# Patient Record
Sex: Female | Born: 1995 | Race: Black or African American | Hispanic: No | Marital: Single | Smoking: Never smoker
Health system: Southern US, Community
[De-identification: ages and names within clinical notes are randomized; demographics above are authoritative.]

## PROBLEM LIST (undated history)

## (undated) DIAGNOSIS — F419 Anxiety disorder, unspecified: Secondary | ICD-10-CM

## (undated) DIAGNOSIS — J45909 Unspecified asthma, uncomplicated: Secondary | ICD-10-CM

## (undated) DIAGNOSIS — T7840XA Allergy, unspecified, initial encounter: Secondary | ICD-10-CM

## (undated) DIAGNOSIS — J069 Acute upper respiratory infection, unspecified: Secondary | ICD-10-CM

## (undated) DIAGNOSIS — J449 Chronic obstructive pulmonary disease, unspecified: Secondary | ICD-10-CM

## (undated) HISTORY — DX: Allergy, unspecified, initial encounter: T78.40XA

## (undated) HISTORY — DX: Unspecified asthma, uncomplicated: J45.909

## (undated) HISTORY — DX: Chronic obstructive pulmonary disease, unspecified: J44.9

## (undated) HISTORY — DX: Acute upper respiratory infection, unspecified: J06.9

## (undated) HISTORY — DX: Anxiety disorder, unspecified: F41.9

---

## 2021-01-03 ENCOUNTER — Ambulatory Visit
Admission: RE | Admit: 2021-01-03 | Discharge: 2021-01-03 | Disposition: A | Discharge status: Home / Self Care | Payer: 59 | Source: Ambulatory Visit | Attending: Nurse Practitioner | Admitting: Nurse Practitioner

## 2021-01-03 ENCOUNTER — Encounter: Payer: Self-pay | Admitting: Nurse Practitioner

## 2021-01-03 ENCOUNTER — Ambulatory Visit: Payer: 59 | Admitting: Nurse Practitioner

## 2021-01-03 ENCOUNTER — Other Ambulatory Visit: Payer: Self-pay

## 2021-01-03 VITALS — BP 116/80 | HR 70 | Temp 98.4°F | Ht 61.0 in | Wt 220.8 lb

## 2021-01-03 DIAGNOSIS — J209 Acute bronchitis, unspecified: Secondary | ICD-10-CM

## 2021-01-03 DIAGNOSIS — Z2821 Immunization not carried out because of patient refusal: Secondary | ICD-10-CM | POA: Diagnosis not present

## 2021-01-03 DIAGNOSIS — R059 Cough, unspecified: Secondary | ICD-10-CM | POA: Diagnosis not present

## 2021-01-03 DIAGNOSIS — Z1159 Encounter for screening for other viral diseases: Secondary | ICD-10-CM

## 2021-01-03 DIAGNOSIS — Z13228 Encounter for screening for other metabolic disorders: Secondary | ICD-10-CM

## 2021-01-03 DIAGNOSIS — Z7689 Persons encountering health services in other specified circumstances: Secondary | ICD-10-CM | POA: Diagnosis not present

## 2021-01-03 DIAGNOSIS — Z87892 Personal history of anaphylaxis: Secondary | ICD-10-CM

## 2021-01-03 MED ORDER — IPRATROPIUM-ALBUTEROL 20-100 MCG/ACT IN AERS: 1.0000 | INHALATION_SPRAY | Freq: Four times a day (QID) | RESPIRATORY_TRACT | 2 refills | Status: DC | PRN

## 2021-01-03 MED ORDER — TRIAMCINOLONE ACETONIDE 40 MG/ML IJ SUSP
40.0000 mg | Freq: Once | INTRAMUSCULAR | Status: AC
  Administered 2021-01-03: 40 mg via INTRAMUSCULAR

## 2021-01-03 NOTE — Progress Notes (Signed)
I,Yamilka [employer] as a Education administrator for [employer], FNP.,have documented all relevant documentation on the behalf of [name], FNP,as directed by  [name], FNP while in the presence of [name], [place]. This visit occurred during the SARS-CoV-2 public health emergency.  Safety protocols were in place, including screening questions prior to the visit, additional usage of staff PPE, and extensive cleaning of exam room while observing appropriate contact time as indicated for disinfecting solutions.  Subjective:     Patient ID: Dana Galvan , female    DOB: 12-31-1995 , 25 y.o.   MRN: [id number]   Chief Complaint  Patient presents with  . Establish Care  . Cough    Patient stated she has had a cough for the past month. She has done a covid test 3 times all were negative. She stated she has tried a few meds and none have worked for her. She is coughing so much she is vomiting now.     HPI  Patient here to establish primary care. She has not had a PCP since the age of 25 y/o. She works for the [employer] in the [employer]. Single. No children.  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  She is originally from [place], [place].  She has not a covid vaccine.  She has a sister who passed from anaphylaxis a couple years ago.  She goes to [facility]. She has had albuterol inhalers in the past but was never diagnosed with asthma.   PMH - healthy   Causey - mother - diabetes, HTN, MI (less than 50 y/o) - stent.  Father - unknown.  She is unaware of grandparents health history all deceased.  Maternal Uncle - colon cancer. She has 10 brothers and sisters and says they are all healthy.   She has had a cough for the last month, she vomited 4 times yesterday due to cough.  She seen a virtual visit on Tuesday last week and was given tessalon perles. She was prescribed an allergy pills.  He wanted her to get Symbicort  but did not get due to being $100. Denies history of smoking.  She does recall having a steroid in the past without any issues  Cough This is a new problem. The current episode started more than 1 month ago. The problem has been unchanged. Cough characteristics: occasional phlegm will come up. Pertinent negatives include no chest pain, fever or headaches. She has tried OTC cough suppressant and prescription cough suppressant for the symptoms.     History reviewed. No pertinent past medical history.   Family History  Problem Relation Age of Onset  . Diabetes Mother   . Heart disease Mother   . Carpal tunnel syndrome Mother   . Hypertension Mother   . Drug abuse Mother   . Alcohol abuse Mother   . Drug abuse Father   . Alcohol abuse Father      Current Outpatient Medications:  .  Etonogestrel (NEXPLANON [place]), Inject into the skin., Disp: , Rfl:  .  Ipratropium-Albuterol (COMBIVENT) 20-100 MCG/ACT AERS respimat, Inhale 1 puff into the lungs every 6 (six) hours as needed for wheezing., Disp: 4 g, Rfl: 2 .  benzonatate (TESSALON) 100 MG capsule, Take 200 mg by mouth 3 (three) times daily as needed for cough., Disp: , Rfl:  .  ondansetron (ZOFRAN) 4 MG tablet, Take 1 tablet (4 mg total) by mouth daily as needed  for nausea or vomiting., Disp: 30 tablet, Rfl: 1   Allergies  Allergen Reactions  . Amoxicillin Anaphylaxis  . Penicillins Anaphylaxis     Review of Systems  Constitutional: Negative.  Negative for fever.  HENT: Negative.   Respiratory: Positive for cough.   Cardiovascular: Negative.  Negative for chest pain, palpitations and leg swelling.  Gastrointestinal: Positive for vomiting.  Endocrine: Negative.  Negative for polydipsia, polyphagia and polyuria.  Genitourinary: Negative.   Musculoskeletal: Negative.   Skin: Negative.   Neurological: Negative.  Negative for dizziness and headaches.  Hematological: Negative.   Psychiatric/Behavioral: Negative.      Today's Vitals    01/03/21 1043  BP: 116/80  Pulse: 70  Temp: 98.4 F (36.9 C)  TempSrc: Oral  Weight: 220 lb 12.8 oz (100.2 kg)  Height: 5' 1" (1.549 m)  PainSc: 0-No pain   Body mass index is 41.72 kg/m.   Objective:  Physical Exam Constitutional:      General: She is not in acute distress.    Appearance: Normal appearance. She is obese.  Cardiovascular:     Rate and Rhythm: Normal rate and regular rhythm.     Pulses: Normal pulses.     Heart sounds: Normal heart sounds.  Pulmonary:     Effort: Pulmonary effort is normal. No respiratory distress.     Breath sounds: Normal breath sounds. No wheezing.  Abdominal:     General: Abdomen is flat. Bowel sounds are normal.     Palpations: Abdomen is soft.  Musculoskeletal:     Cervical back: Normal range of motion and neck supple.  Skin:    General: Skin is warm and dry.     Capillary Refill: Capillary refill takes less than 2 seconds.     Coloration: Skin is not jaundiced.  Neurological:     General: No focal deficit present.     Mental Status: She is alert and oriented to person, place, and time.  Psychiatric:        Mood and Affect: Mood normal.        Behavior: Behavior normal.        Thought Content: Thought content normal.        Judgment: Judgment normal.         Assessment And Plan:     1. Cough  Will treat with steroid injection as this has been ongoing.    She is to use albuterol inhaler as needed and will get a chest xray  She has had negative covid test x3 - DG Chest 2 View; Future - Ipratropium-Albuterol (COMBIVENT) 20-100 MCG/ACT AERS respimat; Inhale 1 puff into the lungs every 6 (six) hours as needed for wheezing.  Dispense: 4 g; Refill: 2 - triamcinolone acetonide (KENALOG-40) injection 40 mg - CMP14+EGFR - CBC  2. Acute bronchitis, unspecified organism - triamcinolone acetonide (KENALOG-40) injection 40 mg - CMP14+EGFR - CBC  3. Establishing care with new doctor, encounter for  4. Influenza vaccination  declined Patient declined influenza vaccination at this time. Patient is aware that influenza vaccine prevents illness in 70% of healthy people, and reduces hospitalizations to 30-70% in elderly. This vaccine is recommended annually. Pt is willing to accept risk associated with refusing vaccination.  5. COVID-19 vaccination declined Declines covid 19 vaccine. Discussed risk of covid 28 and if she changes her mind about the vaccine to call the office.  Encouraged to take multivitamin, vitamin d, vitamin c and zinc to increase immune system. Aware can call office if would  like to have vaccine here at office.   6. History of anaphylaxis  Will refer to allergist due to anaphylaxis to medications before she is to have her covid vaccine - Ambulatory referral to Allergy  7. Encounter for screening for metabolic disorder - Hemoglobin A1c  8. Encounter for hepatitis C screening test for low risk patient  Will check Hepatitis C screening due to recent recommendations to screen all adults 18 years and older - Hepatitis C antibody     Patient was given opportunity to ask questions. Patient verbalized understanding of the plan and was able to repeat key elements of the plan. All questions were answered to their satisfaction.  [name], FNP   I, [name], FNP, have reviewed all documentation for this visit. The documentation on 01/10/21 for the exam, diagnosis, procedures, and orders are all accurate and complete.   THE PATIENT IS ENCOURAGED TO PRACTICE SOCIAL DISTANCING DUE TO THE COVID-19 PANDEMIC.

## 2021-01-03 NOTE — Patient Instructions (Addendum)
COVID-19 Vaccine Information can be found at: [email or web address] For questions related to vaccine distribution or appointments, please email [email or web address] or call [phone].     I am referring you to an allergist to evaluate if you can take the covid vaccine.

## 2021-01-04 LAB — CBC
Hematocrit: 44.5 % (ref 34.0–46.6)
Hemoglobin: 14.6 g/dL (ref 11.1–15.9)
MCH: 28.3 pg (ref 26.6–33.0)
MCHC: 32.8 g/dL (ref 31.5–35.7)
MCV: 86 fL (ref 79–97)
Platelets: 281 10*3/uL (ref 150–450)
RBC: 5.15 x10E6/uL (ref 3.77–5.28)
RDW: 12.7 % (ref 11.7–15.4)
WBC: 5.4 10*3/uL (ref 3.4–10.8)

## 2021-01-04 LAB — CMP14+EGFR
ALT: 14 IU/L (ref 0–32)
AST: 26 IU/L (ref 0–40)
Albumin/Globulin Ratio: 1.4 (ref 1.2–2.2)
Albumin: 4.3 g/dL (ref 3.9–5.0)
Alkaline Phosphatase: 123 IU/L — ABNORMAL HIGH (ref 44–121)
BUN/Creatinine Ratio: 6 — ABNORMAL LOW (ref 9–23)
BUN: 5 mg/dL — ABNORMAL LOW (ref 6–20)
Bilirubin Total: 0.4 mg/dL (ref 0.0–1.2)
CO2: 20 mmol/L (ref 20–29)
Calcium: 9.3 mg/dL (ref 8.7–10.2)
Chloride: 103 mmol/L (ref 96–106)
Creatinine, Ser: 0.86 mg/dL (ref 0.57–1.00)
GFR calc Af Amer: 109 mL/min/{1.73_m2} (ref >59)
GFR calc non Af Amer: 94 mL/min/{1.73_m2} (ref >59)
Globulin, Total: 3 g/dL (ref 1.5–4.5)
Glucose: 104 mg/dL — ABNORMAL HIGH (ref 65–99)
Potassium: 4.6 mmol/L (ref 3.5–5.2)
Sodium: 140 mmol/L (ref 134–144)
Total Protein: 7.3 g/dL (ref 6.0–8.5)

## 2021-01-04 LAB — HEMOGLOBIN A1C
Est. average glucose Bld gHb Est-mCnc: 105 mg/dL
Hgb A1c MFr Bld: 5.3 % (ref 4.8–5.6)

## 2021-01-04 LAB — HEPATITIS C ANTIBODY: Hep C Virus Ab: 0.1 s/co ratio (ref 0.0–0.9)

## 2021-01-04 NOTE — Progress Notes (Signed)
Let's see how she is doing after taking the antibiotic, does she want something for vomiting? Make sure to see if she is having abdomen pain?

## 2021-01-05 ENCOUNTER — Other Ambulatory Visit: Payer: Self-pay | Admitting: Nurse Practitioner

## 2021-01-05 DIAGNOSIS — R110 Nausea: Secondary | ICD-10-CM

## 2021-01-05 MED ORDER — ONDANSETRON HCL 4 MG PO TABS: 4.0000 mg | ORAL_TABLET | Freq: Every day | ORAL | 1 refills | Status: DC | PRN

## 2021-01-24 ENCOUNTER — Other Ambulatory Visit: Payer: Self-pay | Admitting: Nurse Practitioner

## 2021-01-24 ENCOUNTER — Other Ambulatory Visit: Payer: Self-pay

## 2021-01-24 ENCOUNTER — Encounter: Payer: Self-pay | Admitting: Nurse Practitioner

## 2021-01-24 MED ORDER — BUDESONIDE-FORMOTEROL FUMARATE 80-4.5 MCG/ACT IN AERO: 2.0000 | INHALATION_SPRAY | Freq: Every day | RESPIRATORY_TRACT | 12 refills | Status: DC

## 2021-01-25 ENCOUNTER — Other Ambulatory Visit: Payer: Self-pay

## 2021-01-25 DIAGNOSIS — R059 Cough, unspecified: Secondary | ICD-10-CM

## 2021-01-25 MED ORDER — FLUTICASONE PROPIONATE HFA 44 MCG/ACT IN AERO: 2.0000 | INHALATION_SPRAY | Freq: Two times a day (BID) | RESPIRATORY_TRACT | 2 refills | Status: DC

## 2021-02-15 ENCOUNTER — Other Ambulatory Visit: Payer: Self-pay

## 2021-02-15 DIAGNOSIS — R059 Cough, unspecified: Secondary | ICD-10-CM

## 2021-02-15 MED ORDER — FLUTICASONE PROPIONATE HFA 44 MCG/ACT IN AERO: 2.0000 | INHALATION_SPRAY | Freq: Two times a day (BID) | RESPIRATORY_TRACT | 2 refills | Status: DC

## 2021-02-19 ENCOUNTER — Other Ambulatory Visit: Payer: Self-pay | Admitting: Nurse Practitioner

## 2021-02-19 DIAGNOSIS — R059 Cough, unspecified: Secondary | ICD-10-CM

## 2021-02-19 MED ORDER — FLUTICASONE PROPIONATE HFA 44 MCG/ACT IN AERO: 2.0000 | INHALATION_SPRAY | Freq: Two times a day (BID) | RESPIRATORY_TRACT | 1 refills | Status: DC

## 2021-02-24 ENCOUNTER — Ambulatory Visit (INDEPENDENT_AMBULATORY_CARE_PROVIDER_SITE_OTHER): Payer: 59 | Admitting: Allergy

## 2021-02-24 ENCOUNTER — Encounter: Payer: 59 | Admitting: Nurse Practitioner

## 2021-02-24 ENCOUNTER — Encounter: Payer: Self-pay | Admitting: Allergy

## 2021-02-24 ENCOUNTER — Other Ambulatory Visit: Payer: Self-pay

## 2021-02-24 VITALS — BP 120/78 | HR 72 | Temp 97.8°F | Resp 18 | Ht 62.0 in | Wt 232.8 lb

## 2021-02-24 DIAGNOSIS — R058 Other specified cough: Secondary | ICD-10-CM

## 2021-02-24 DIAGNOSIS — T50905D Adverse effect of unspecified drugs, medicaments and biological substances, subsequent encounter: Secondary | ICD-10-CM

## 2021-02-24 DIAGNOSIS — J3089 Other allergic rhinitis: Secondary | ICD-10-CM | POA: Diagnosis not present

## 2021-02-24 MED ORDER — FLOVENT HFA 110 MCG/ACT IN AERO: 2.0000 | INHALATION_SPRAY | Freq: Two times a day (BID) | RESPIRATORY_TRACT | 5 refills | Status: DC

## 2021-02-24 MED ORDER — ALBUTEROL SULFATE HFA 108 (90 BASE) MCG/ACT IN AERS: 2.0000 | INHALATION_SPRAY | RESPIRATORY_TRACT | 1 refills | Status: DC | PRN

## 2021-02-24 NOTE — Progress Notes (Signed)
Appointment cancelled pt already seen asthma and allergist regarding cough. YL,RMA

## 2021-02-24 NOTE — Patient Instructions (Signed)
Medication allergy - discussed today you would be eligible for penicillin (PCN) testing and recommended skin testing first followed by graded oral challenge if able.  If you would like to determine if you are no longer allergic to PCN then you can schedule a testing visit - in regards to the Covid vaccine I do not have a reason for you to not get the vaccine.  You have no contraindications at this time that prevents you from receiving the vaccine.  Thus if interested we do offer vaccine in our office during our vaccine clinic days  Cough - most likely allergy driven - stop Flovent and start Flovent 2 puffs twice a day - have access to albuterol inhaler 2 puffs every 4-6 hours as needed for cough/wheeze/shortness of breath/chest tightness.  May use 15-20 minutes prior to activity.   Monitor frequency of use.    Control goals:   Full participation in all desired activities (may need albuterol before activity)  Albuterol use two time or less a week on average (not counting use with activity)  Cough interfering with sleep two time or less a month  Oral steroids no more than once a year  No hospitalizations  Allergies - environmental allergy testing is positive to grass pollen, tree pollen, mold, dust mite and cat - allergen avoidance measures discussed/handouts provided - stop using humidifier - for allergy symptom can take long-acting antihistamine like Zyrtec, Allegra or Xyzal - use Flonase 2 sprays each nostril daily for 1-2 weeks at a time before stopping once nasal congestion improves for maximum benefit  Follow-up in 3 months or sooner if needed

## 2021-02-24 NOTE — Progress Notes (Signed)
New Patient Note  RE: Dana Galvan MRN: [id number] DOB: 1996-07-11 Date of Office Visit: 02/24/2021  Referring provider: Arnette Felts, FNP Primary care provider: Arnette Felts, FNP  Chief Complaint: Vaccine concern  History of present illness: Dana Galvan is a 25 y.o. female presenting today for consultation for history of anaphylaxis.  She states she was referred to determine if she is able to receive the COVID vaccine.  She states she has not had any issues with any previous vaccines to her knowledge however she declines influenza vaccine.  She believes the last vaccine short of hot would have been tetanus as a teen.  She states she did receive her childhood vaccines.  She has not had any surgeries or colonoscopy.  She has not had any medications for cleanout or polyethylene glycol to her knowledge.  She has not had any dermal fillers.  She does report a penicillin allergy.  She reports had penicillin as a infant/toddler and reports had anaphylaxis.  States she was told the symptoms symptoms included difficulty breathing due to throat tightness/closure and hives.  She has been avoiding since childhood.  She states all of her siblings have a penicillin allergy.  She states a sister died from penicillin reaction.  She states she has had a persistent cough for past 2 months and was prescribed flovent inhaler that she has been taking 2 puffs twice a day.  She does feel like the Flovent has helped a little bit but she still has the cough most days.  She does not have a rescue inhaler at this time.  She states she was prescribed Combivent inhaler but it was not covered by insurance. Not having any notable wheezing or chest tightness nor shortness of breath.  She states the coughing however can get so bad that she feels like she has to vomit.  She has never had any issues like this before.  She has no previous asthma history as a child.  She does report symptoms of nasal  congestion/drainage and sneezing. Mostly during pollen season.   Has used flonase in the past for congestion.     No history of eczema or food allergy.    Review of systems in the past 4 weeks: Review of Systems  Constitutional: Negative.   HENT: Negative.   Eyes: Negative.   Respiratory:       See HPI  Cardiovascular: Negative.   Gastrointestinal: Negative.   Musculoskeletal: Negative.   Skin: Negative.   Neurological: Negative.     All other systems negative unless noted above in HPI  Past medical history: Past Medical History:  Diagnosis Date  . Recurrent upper respiratory infection (URI)     Past surgical history: History reviewed. No pertinent surgical history.  Family history:  Family History  Problem Relation Age of Onset  . Diabetes Mother   . Heart disease Mother   . Carpal tunnel syndrome Mother   . Hypertension Mother   . Drug abuse Mother   . Alcohol abuse Mother   . Drug abuse Father   . Alcohol abuse Father     Social history: She lives in an apartment with out carpeting with electric heating and central cooling.  No pets in the home.  There are dogs and cats outside the home.  There is no concern for water damage, mildew or roaches in the home.  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  She has no smoking history.  Medication List: Current Outpatient Medications  Medication Sig Dispense Refill  . albuterol (VENTOLIN HFA) 108 (90 Base) MCG/ACT inhaler Inhale 2 puffs into the lungs every 4 (four) hours as needed for wheezing or shortness of breath. 18 g 1  . Etonogestrel (NEXPLANON [place]) Inject into the skin.    . fluticasone (FLOVENT HFA) 110 MCG/ACT inhaler Inhale 2 puffs into the lungs 2 (two) times daily. 1 each 5   No current facility-administered medications for this visit.    Known medication allergies: Allergies  Allergen Reactions  . Amoxicillin Anaphylaxis  . Penicillins Anaphylaxis      Physical examination: Blood pressure 120/78, pulse 72, temperature 97.8 F (36.6 C), resp. rate 18, height 5\' 2"  (1.575 m), weight 232 lb 12.8 oz (105.6 kg), SpO2 98 %.  General: Alert, interactive, in no acute distress. HEENT: PERRLA, TMs pearly gray, turbinates non-edematous without discharge, post-pharynx non erythematous. Neck: Supple without lymphadenopathy. Lungs: Clear to auscultation without wheezing, rhonchi or rales. {no increased work of breathing. CV: Normal S1, S2 without murmurs. Abdomen: Nondistended, nontender. Skin: Warm and dry, without lesions or rashes. Extremities:  No clubbing, cyanosis or edema. Neuro:   Grossly intact.  Diagnositics/Labs:  Spirometry: FEV1: 2.16L 82%, FVC: 2.67 L 88%, ratio consistent with Nonobstructive pattern  Allergy testing: Environmental allergy skin prick testing is positive to grass.,  Perennial rye, sweet vernal, hickory, maple, both dust mites, cat,tricophyton mentagrophytes. Allergy testing results were read and interpreted by provider, documented by clinical staff.   Assessment and plan:   Medication allergy - discussed today you would be eligible for penicillin (PCN) testing and recommended skin testing first followed by graded oral challenge if able.  If you would like to determine if you are no longer allergic to PCN then you can schedule a testing visit - in regards to the Covid vaccine I do not have a reason for you to not get the vaccine.  You have no contraindications at this time that prevents you from receiving the vaccine.  Thus if interested we do offer vaccine in our office during our vaccine clinic days  Cough - most likely allergy driven - stop Flovent and start Flovent 2 puffs twice a day - have access to albuterol inhaler 2 puffs every 4-6 hours as needed for cough/wheeze/shortness of breath/chest tightness.  May use 15-20 minutes prior to activity.   Monitor frequency of use.    Control goals:    Full participation in all desired activities (may need albuterol before activity)  Albuterol use two time or less a week on average (not counting use with activity)  Cough interfering with sleep two time or less a month  Oral steroids no more than once a year  No hospitalizations  Allergic rhinitis - environmental allergy testing is positive to grass pollen, tree pollen, mold, dust mite and cat - allergen avoidance measures discussed/handouts provided - stop using humidifier - for allergy symptom can take long-acting antihistamine like Zyrtec, Allegra or Xyzal - use Flonase 2 sprays each nostril daily for 1-2 weeks at a time before stopping once nasal congestion improves for maximum benefit  Follow-up in 3 months or sooner if needed  I appreciate the opportunity to take part in [place] care. Please do not hesitate to contact me with questions.  Sincerely,   [place], MD Allergy/Immunology [facility]

## 2021-02-25 ENCOUNTER — Telehealth: Payer: Self-pay | Admitting: Allergy

## 2021-02-25 MED ORDER — FLOVENT HFA 110 MCG/ACT IN AERO: 2.0000 | INHALATION_SPRAY | Freq: Two times a day (BID) | RESPIRATORY_TRACT | 3 refills | Status: DC

## 2021-02-25 MED ORDER — ALBUTEROL SULFATE HFA 108 (90 BASE) MCG/ACT IN AERS: 2.0000 | INHALATION_SPRAY | RESPIRATORY_TRACT | 2 refills | Status: DC | PRN

## 2021-02-25 MED ORDER — FEXOFENADINE HCL 180 MG PO TABS
180.0000 mg | ORAL_TABLET | Freq: Every day | ORAL | 3 refills | Status: DC
Start: 2021-02-25 — End: 2023-07-27

## 2021-02-25 NOTE — Telephone Encounter (Signed)
Called and left a message for patient to inform her that her 90 day supply medications have been sent to [facility]. Patient was notified if she had questions or concerns.

## 2021-02-25 NOTE — Telephone Encounter (Signed)
Pt called back asking for a 90 day supply prescription for Albuterol to be sent in.   Please advise.

## 2021-02-25 NOTE — Telephone Encounter (Signed)
Pt called asking if a prescription for a 90 day supply for Flovent could be sent in to her pharmacy.   Please advise.

## 2021-03-14 ENCOUNTER — Other Ambulatory Visit: Payer: Self-pay | Admitting: *Deleted

## 2021-03-14 MED ORDER — PROAIR HFA 108 (90 BASE) MCG/ACT IN AERS: 2.0000 | INHALATION_SPRAY | RESPIRATORY_TRACT | 1 refills | Status: DC | PRN

## 2021-03-17 ENCOUNTER — Other Ambulatory Visit: Payer: Self-pay

## 2021-03-17 ENCOUNTER — Encounter: Payer: Self-pay | Admitting: Nurse Practitioner

## 2021-03-17 ENCOUNTER — Ambulatory Visit (INDEPENDENT_AMBULATORY_CARE_PROVIDER_SITE_OTHER): Payer: 59 | Admitting: Nurse Practitioner

## 2021-03-17 VITALS — BP 122/70 | HR 89 | Temp 98.0°F | Ht 62.0 in | Wt 237.0 lb

## 2021-03-17 DIAGNOSIS — Z23 Encounter for immunization: Secondary | ICD-10-CM

## 2021-03-17 DIAGNOSIS — R059 Cough, unspecified: Secondary | ICD-10-CM

## 2021-03-17 MED ORDER — ALBUTEROL SULFATE HFA 108 (90 BASE) MCG/ACT IN AERS: 2.0000 | INHALATION_SPRAY | RESPIRATORY_TRACT | 2 refills | Status: DC | PRN

## 2021-03-17 MED ORDER — TRIAMCINOLONE ACETONIDE 40 MG/ML IJ SUSP
40.0000 mg | Freq: Once | INTRAMUSCULAR | Status: AC
  Administered 2021-03-17: 40 mg via INTRAMUSCULAR

## 2021-03-17 NOTE — Progress Notes (Signed)
Tomasa Hose as a scribe for Arnette Felts, FNP.,have documented all relevant documentation on the behalf of Arnette Felts, FNP,as directed by  Arnette Felts, FNP while in the presence of Arnette Felts, FNP. This visit occurred during the SARS-CoV-2 public health emergency.  Safety protocols were in place, including screening questions prior to the visit, additional usage of staff PPE, and extensive cleaning of exam room while observing appropriate contact time as indicated for disinfecting solutions.  Subjective:     Patient ID: Dana Galvan , female    DOB: 05/14/1996 , 25 y.o.   MRN: [id number]   Chief Complaint  Patient presents with  . Cough    HPI  Pt here today for a cough.  She was tested for environmental allergies and increased dose of inhaler. She has used her albuterol inhaler more than 3 times this week.  She is unsure of how many times she has used. She has not called the allergist.    Cough This is a chronic problem. The current episode started more than 1 year ago. The cough is productive of sputum. Pertinent negatives include no chest pain, chills, headaches, nasal congestion, sore throat or shortness of breath.     Past Medical History:  Diagnosis Date  . Recurrent upper respiratory infection (URI)      Family History  Problem Relation Age of Onset  . Diabetes Mother   . Heart disease Mother   . Carpal tunnel syndrome Mother   . Hypertension Mother   . Drug abuse Mother   . Alcohol abuse Mother   . Drug abuse Father   . Alcohol abuse Father      Current Outpatient Medications:  .  Etonogestrel (NEXPLANON [place]), Inject into the skin., Disp: , Rfl:  .  fexofenadine (ALLEGRA) 180 MG tablet, Take 1 tablet (180 mg total) by mouth daily., Disp: 90 tablet, Rfl: 3 .  fluticasone (FLOVENT HFA) 110 MCG/ACT inhaler, Inhale 2 puffs into the lungs 2 (two) times daily., Disp: 36 g, Rfl: 3 .  albuterol (VENTOLIN HFA) 108 (90 Base) MCG/ACT inhaler, Inhale 2 puffs  into the lungs every 4 (four) hours as needed for wheezing or shortness of breath., Disp: 54 g, Rfl: 2  Current Facility-Administered Medications:  .  triamcinolone acetonide (KENALOG-40) injection 40 mg, 40 mg, Intramuscular, Once, Arnette Felts, FNP   Allergies  Allergen Reactions  . Amoxicillin Anaphylaxis  . Penicillins Anaphylaxis     Review of Systems  Constitutional: Negative.  Negative for chills and fatigue.  HENT: Negative.  Negative for sore throat.   Respiratory: Positive for cough. Negative for shortness of breath.   Cardiovascular: Negative.  Negative for chest pain, palpitations and leg swelling.  Endocrine: Negative for polydipsia, polyphagia and polyuria.  Musculoskeletal: Negative.   Skin: Negative.   Neurological: Negative for dizziness and headaches.  Psychiatric/Behavioral: Negative.      Today's Vitals   03/17/21 1438  BP: 122/70  Pulse: 89  Temp: 98 F (36.7 C)  TempSrc: Oral  SpO2: 98%  Weight: 237 lb (107.5 kg)  Height: 5\' 2"  (1.575 m)   Body mass index is 43.35 kg/m.  Wt Readings from Last 3 Encounters:  03/17/21 237 lb (107.5 kg)  02/24/21 232 lb 12.8 oz (105.6 kg)  01/03/21 220 lb 12.8 oz (100.2 kg)   Objective:  Physical Exam Vitals reviewed.  Constitutional:      General: She is not in acute distress.    Appearance: Normal appearance. She is obese.  Cardiovascular:  Rate and Rhythm: Normal rate and regular rhythm.     Pulses: Normal pulses.     Heart sounds: Normal heart sounds. No murmur heard.   Pulmonary:     Effort: Pulmonary effort is normal. No respiratory distress.     Breath sounds: Normal breath sounds. No wheezing.     Comments: Cough is hacking and appears "tight" Chest:     Chest wall: No tenderness.  Skin:    Capillary Refill: Capillary refill takes less than 2 seconds.  Neurological:     General: No focal deficit present.     Mental Status: She is alert and oriented to person, place, and time.     Cranial  Nerves: No cranial nerve deficit.     Motor: No weakness.  Psychiatric:        Mood and Affect: Mood normal.        Behavior: Behavior normal.        Thought Content: Thought content normal.        Judgment: Judgment normal.         Assessment And Plan:     1. Cough  Will treat with kenalog, she is to continue her follow up with Dr. Delorse Lek.   She may be having bronchospasms  She has clear lung sounds but are slightly constricted - triamcinolone acetonide (KENALOG-40) injection 40 mg  2. Need for HPV vaccine  Rx sent to pharmacy - HPV 9-valent vaccine,Recombinat     Patient was given opportunity to ask questions. Patient verbalized understanding of the plan and was able to repeat key elements of the plan. All questions were answered to their satisfaction.  Arnette Felts, FNP   I, Arnette Felts, FNP, have reviewed all documentation for this visit. The documentation on 03/17/21 for the exam, diagnosis, procedures, and orders are all accurate and complete.   IF YOU HAVE BEEN REFERRED TO A SPECIALIST, IT MAY TAKE 1-2 WEEKS TO SCHEDULE/PROCESS THE REFERRAL. IF YOU HAVE NOT HEARD FROM US/SPECIALIST IN TWO WEEKS, PLEASE GIVE [place] A CALL AT [phone] X [phone].   THE PATIENT IS ENCOURAGED TO PRACTICE SOCIAL DISTANCING DUE TO THE COVID-19 PANDEMIC.

## 2021-04-26 MED ORDER — HPV 9-VALENT RECOMB VACCINE IM SUSP: 0.5000 mL | Freq: Once | INTRAMUSCULAR | 0 refills | Status: AC

## 2021-05-25 ENCOUNTER — Ambulatory Visit: Payer: 59 | Admitting: Allergy

## 2021-05-25 ENCOUNTER — Encounter: Payer: Self-pay | Admitting: Allergy

## 2021-05-25 ENCOUNTER — Other Ambulatory Visit: Payer: Self-pay

## 2021-05-25 VITALS — BP 118/72 | HR 88 | Temp 98.2°F | Resp 16

## 2021-05-25 DIAGNOSIS — R058 Other specified cough: Secondary | ICD-10-CM | POA: Diagnosis not present

## 2021-05-25 DIAGNOSIS — T50905D Adverse effect of unspecified drugs, medicaments and biological substances, subsequent encounter: Secondary | ICD-10-CM

## 2021-05-25 DIAGNOSIS — J3089 Other allergic rhinitis: Secondary | ICD-10-CM

## 2021-05-25 MED ORDER — AIRDUO DIGIHALER 232-14 MCG/ACT IN AEPB: 1.0000 | INHALATION_SPRAY | Freq: Two times a day (BID) | RESPIRATORY_TRACT | 2 refills | Status: DC

## 2021-05-25 MED ORDER — AZELASTINE-FLUTICASONE 137-50 MCG/ACT NA SUSP: 1.0000 | Freq: Two times a day (BID) | NASAL | 2 refills | Status: DC

## 2021-05-25 NOTE — Progress Notes (Signed)
Follow-up Note  RE: Dana Galvan MRN: [id number] DOB: Sep 24, 1996 Date of Office Visit: 05/25/2021   History of present illness: Dana Galvan is a 25 y.o. female presenting today for follow-up of cough, allergic rhinitis and medication allergy.  She was last seen in the office on 02/24/2021 by myself.  She has not had any major health changes, surgeries or hospitalizations.  She states her cough has not gotten any better.  She is still using albuterol pretty much every day and not really getting much relief.  She states she is coughing now to the point that she might vomit.  She also states she is noting a tickle in her throat that then leads to more coughing.  She has not noted any change in her symptoms with increased from low-dose Flovent to medium dose Flovent 2 puffs twice a day.  She has used Symbicort in the past as well and did not find this to be a very helpful either.  She has interested in a second opinion with pulmonology. With her allergic rhinitis she states the Allegra is antihistamine that she has been taking and she is not sure if she is noticing any differences.  We will continue on her throat as above she does feel like she is having throat clearing and neck and sinus drainage.  She has Flonase but states she does not need to use this on a consistent basis. She continues to avoid penicillin-based antibiotics and at this time is not interested in a oral challenge.  Review of systems: Review of Systems  Constitutional: Negative.   HENT:         See HPI  Eyes: Negative.   Respiratory:         See HPI  Cardiovascular: Negative.   Gastrointestinal: Negative.   Musculoskeletal: Negative.   Skin: Negative.   Neurological: Negative.    All other systems negative unless noted above in HPI  Past medical/social/surgical/family history have been reviewed and are unchanged unless specifically indicated below.  No changes  Medication List: Current Outpatient Medications   Medication Sig Dispense Refill   albuterol (VENTOLIN HFA) 108 (90 Base) MCG/ACT inhaler Inhale 2 puffs into the lungs every 4 (four) hours as needed for wheezing or shortness of breath. 54 g 2   Etonogestrel (NEXPLANON [place]) Inject into the skin.     fexofenadine (ALLEGRA) 180 MG tablet Take 1 tablet (180 mg total) by mouth daily. 90 tablet 3   fluticasone (FLOVENT HFA) 110 MCG/ACT inhaler Inhale 2 puffs into the lungs 2 (two) times daily. 36 g 3   No current facility-administered medications for this visit.     Known medication allergies: Allergies  Allergen Reactions   Amoxicillin Anaphylaxis   Penicillins Anaphylaxis     Physical examination: Blood pressure 118/72, pulse 88, temperature 98.2 F (36.8 C), temperature source Temporal, resp. rate 16.  General: Alert, interactive, in no acute distress. HEENT: PERRLA, TMs pearly gray, turbinates minimally edematous with clear discharge, post-pharynx non erythematous. Neck: Supple without lymphadenopathy. Lungs: Clear to auscultation without wheezing, rhonchi or rales. {no increased work of breathing. CV: Normal S1, S2 without murmurs. Abdomen: Nondistended, nontender. Skin: Warm and dry, without lesions or rashes. Extremities:  No clubbing, cyanosis or edema. Neuro:   Grossly intact.  Diagnositics/Labs:  ACT score is 18 which indicates not good control  Assessment and plan:   Cough - most likely allergy driven - stop Flovent  - try AirDuo 232/88mcg 1 puff twice a day.  Sample  and copay card provided - have access to albuterol inhaler 2 puffs every 4-6 hours as needed for cough/wheeze/shortness of breath/chest tightness.  May use 15-20 minutes prior to activity.   Monitor frequency of use.   - will place referral for pulmonology for second opinion for chronic cough  Control goals:  Full participation in all desired activities (may need albuterol before activity) Albuterol use two time or less a week on average (not counting  use with activity) Cough interfering with sleep two time or less a month Oral steroids no more than once a year No hospitalizations  Allergic rhinitis - continue grass pollen, tree pollen, mold, dust mite and cat - for allergy symptom can take long-acting antihistamine like Xyzal 5mg  daily as needed - trial dymista 1 spray each nostril twice a day.  This is a combination nasal spray with Flonase + Astelin (nasal antihistamine).  This helps with both nasal congestion and drainage.   Medication allergy - you would be eligible for penicillin (PCN) testing and recommended skin testing first followed by graded oral challenge if able.  If you would like to determine if you are no longer allergic to PCN then you can schedule a testing visit  Follow-up in 3-4 months or sooner if needed  I appreciate the opportunity to take part in [facility] care. Please do not hesitate to contact me with questions.  Sincerely,   [facility], MD Allergy/Immunology [facility]

## 2021-05-25 NOTE — Patient Instructions (Addendum)
Cough - most likely allergy driven - stop Flovent  - try AirDuo 232/22mcg 1 puff twice a day.  Sample and copay card provided - have access to albuterol inhaler 2 puffs every 4-6 hours as needed for cough/wheeze/shortness of breath/chest tightness.  May use 15-20 minutes prior to activity.   Monitor frequency of use.   - will place referral for pulmonology for second opinion for chronic cough  Control goals:  Full participation in all desired activities (may need albuterol before activity) Albuterol use two time or less a week on average (not counting use with activity) Cough interfering with sleep two time or less a month Oral steroids no more than once a year No hospitalizations  Allergies - continue grass pollen, tree pollen, mold, dust mite and cat - for allergy symptom can take long-acting antihistamine like Xyzal 5mg  daily as needed - trial dymista 1 spray each nostril twice a day.  This is a combination nasal spray with Flonase + Astelin (nasal antihistamine).  This helps with both nasal congestion and drainage.   Medication allergy - you would be eligible for penicillin (PCN) testing and recommended skin testing first followed by graded oral challenge if able.  If you would like to determine if you are no longer allergic to PCN then you can schedule a testing visit  Follow-up in 3-4 months or sooner if needed

## 2021-05-26 ENCOUNTER — Telehealth: Payer: Self-pay

## 2021-05-26 ENCOUNTER — Ambulatory Visit (INDEPENDENT_AMBULATORY_CARE_PROVIDER_SITE_OTHER): Payer: 59 | Admitting: Nurse Practitioner

## 2021-05-26 ENCOUNTER — Encounter: Payer: Self-pay | Admitting: Nurse Practitioner

## 2021-05-26 VITALS — BP 122/68 | HR 70 | Temp 98.5°F | Ht 62.0 in | Wt 242.0 lb

## 2021-05-26 DIAGNOSIS — Z2821 Immunization not carried out because of patient refusal: Secondary | ICD-10-CM

## 2021-05-26 DIAGNOSIS — Z0000 Encounter for general adult medical examination without abnormal findings: Secondary | ICD-10-CM

## 2021-05-26 DIAGNOSIS — Z6841 Body Mass Index (BMI) 40.0 and over, adult: Secondary | ICD-10-CM

## 2021-05-26 DIAGNOSIS — R053 Chronic cough: Secondary | ICD-10-CM

## 2021-05-26 NOTE — Telephone Encounter (Signed)
-----   Message from [facility] Larose Hires, MD sent at 05/25/2021  5:27 PM EDT ----- Please place referral for pulmonology for "chronic cough not responding to inhaled steroid"

## 2021-05-26 NOTE — Telephone Encounter (Signed)
Patient confirmed referral to [facility] and she is going to give them a call today to get scheduled.  I will check back in next week to make sure she has been scheduled.  Thanks

## 2021-05-26 NOTE — Progress Notes (Signed)
I,Dana Galvan,acting as a Education administrator for [employer], FNP.,have documented all relevant documentation on the behalf of Dana Brine, FNP,as directed by  Dana Brine, FNP while in the presence of Dana Galvan, Dana Galvan.  This visit occurred during the SARS-CoV-2 public health emergency.  Safety protocols were in place, including screening questions prior to the visit, additional usage of staff PPE, and extensive cleaning of exam room while observing appropriate contact time as indicated for disinfecting solutions.  Subjective:     Patient ID: Dana Galvan , female    DOB: July 22, 1996 , 25 y.o.   MRN: [id number]   Chief Complaint  Patient presents with   Annual Exam    HPI  Patient is here for physical exam. She is followed by [facility] for her GYN care.   Wt Readings from Last 3 Encounters: 05/26/21 : 242 lb (109.8 kg) 03/17/21 : 237 lb (107.5 kg) 02/24/21 : 232 lb 12.8 oz (105.6 kg)      Past Medical History:  Diagnosis Date   Recurrent upper respiratory infection (URI)      Family History  Problem Relation Age of Onset   Diabetes Mother    Heart disease Mother    Carpal tunnel syndrome Mother    Hypertension Mother    Drug abuse Mother    Alcohol abuse Mother    Drug abuse Father    Alcohol abuse Father      Current Outpatient Medications:    Azelastine-Fluticasone 137-50 MCG/ACT SUSP, Place 1 spray into the nose 2 (two) times daily., Disp: 23 g, Rfl: 2   Etonogestrel (NEXPLANON [place]), Inject into the skin., Disp: , Rfl:    fexofenadine (ALLEGRA) 180 MG tablet, Take 1 tablet (180 mg total) by mouth daily., Disp: 90 tablet, Rfl: 3   Fluticasone-Salmeterol,sensor, (AIRDUO DIGIHALER) 232-14 MCG/ACT AEPB, Inhale 1 puff into the lungs 2 (two) times daily., Disp: 1 each, Rfl: 2   Allergies  Allergen Reactions   Amoxicillin Anaphylaxis   Penicillins Anaphylaxis      The patient states she uses IUD for birth control. Last LMP was No LMP recorded. Patient has had  an implant.. Negative for Dysmenorrhea and Negative for Menorrhagia. Negative for: breast discharge, breast lump(s), breast pain and breast self exam. Associated symptoms include abnormal vaginal bleeding. Pertinent negatives include abnormal bleeding (hematology), anxiety, decreased libido, depression, difficulty falling sleep, dyspareunia, history of infertility, nocturia, sexual dysfunction, sleep disturbances, urinary incontinence, urinary urgency, vaginal discharge and vaginal itching. Diet regular.The patient states her exercise level is    . The patient's tobacco use is:  Social History   Tobacco Use  Smoking Status Never  Smokeless Tobacco Never  . She has been exposed to passive smoke. The patient's alcohol use is:  Social History   Substance and Sexual Activity  Alcohol Use Yes   Comment: occasionally    Additional information: Last pap 09/08/2020, next one scheduled for 09/09/2023.    Review of Systems  Constitutional: Negative.   HENT: Negative.    Eyes: Negative.   Respiratory: Negative.    Cardiovascular: Negative.  Negative for chest pain, palpitations and leg swelling.  Gastrointestinal: Negative.   Endocrine: Negative.   Genitourinary: Negative.   Musculoskeletal: Negative.   Skin: Negative.   Allergic/Immunologic: Negative.   Neurological: Negative.   Hematological: Negative.   Psychiatric/Behavioral: Negative.      Today's Vitals   05/26/21 1513  BP: 122/68  Pulse: 70  Temp: 98.5 F (36.9 C)  TempSrc: Oral  Weight: 242  lb (109.8 kg)  Height: '5\' 2"'  (1.575 m)   Body mass index is 44.26 kg/m.  Wt Readings from Last 3 Encounters:  05/26/21 242 lb (109.8 kg)  03/17/21 237 lb (107.5 kg)  02/24/21 232 lb 12.8 oz (105.6 kg)    Objective:  Physical Exam Vitals reviewed.  Constitutional:      General: She is not in acute distress.    Appearance: Normal appearance. She is well-developed. She is obese.  HENT:     Head: Normocephalic and atraumatic.      Right Ear: Hearing, tympanic membrane, ear canal and external ear normal. There is no impacted cerumen.     Left Ear: Hearing, tympanic membrane, ear canal and external ear normal. There is no impacted cerumen.     Nose:     Comments: Deferred - masked    Mouth/Throat:     Comments: Deferred - masked Eyes:     General: Lids are normal.     Extraocular Movements: Extraocular movements intact.     Conjunctiva/sclera: Conjunctivae normal.     Pupils: Pupils are equal, round, and reactive to light.     Funduscopic exam:    Right eye: No papilledema.        Left eye: No papilledema.  Neck:     Thyroid: No thyroid mass.     Vascular: No carotid bruit.  Cardiovascular:     Rate and Rhythm: Normal rate and regular rhythm.     Pulses: Normal pulses.     Heart sounds: Normal heart sounds. No murmur heard. Pulmonary:     Effort: Pulmonary effort is normal. No respiratory distress.     Breath sounds: Normal breath sounds. No wheezing.     Comments: Cough is hacking and appears "tight" Chest:     Chest wall: No mass or tenderness.  Breasts:    Tanner Score is 5.     Right: Normal. No mass, tenderness, axillary adenopathy or supraclavicular adenopathy.     Left: Normal. No mass, tenderness, axillary adenopathy or supraclavicular adenopathy.  Abdominal:     General: Abdomen is flat. Bowel sounds are normal. There is no distension.     Palpations: Abdomen is soft.     Tenderness: There is no abdominal tenderness.  Genitourinary:    Rectum: Guaiac result negative.  Musculoskeletal:        General: No swelling or tenderness. Normal range of motion.     Cervical back: Full passive range of motion without pain, normal range of motion and neck supple.     Right lower leg: No edema.     Left lower leg: No edema.  Lymphadenopathy:     Upper Body:     Right upper body: No supraclavicular, axillary or pectoral adenopathy.     Left upper body: No supraclavicular, axillary or pectoral adenopathy.   Skin:    General: Skin is warm and dry.     Capillary Refill: Capillary refill takes less than 2 seconds.  Neurological:     General: No focal deficit present.     Mental Status: She is alert and oriented to person, place, and time.     Cranial Nerves: No cranial nerve deficit.     Sensory: No sensory deficit.     Motor: No weakness.  Psychiatric:        Mood and Affect: Mood normal.        Behavior: Behavior normal.        Thought Content: Thought content normal.  Judgment: Judgment normal.        Assessment And Plan:     1. Encounter for annual physical exam Behavior modifications discussed and diet history reviewed.   Pt will continue to exercise regularly and modify diet with low GI, plant based foods and decrease intake of processed foods.  Recommend intake of daily multivitamin, Vitamin D, and calcium.  Recommend mammogram and colonoscopy for preventive screenings, as well as recommend immunizations that include influenza, TDAP (up to date).  - CMP14+EGFR - Lipid panel  2. 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  - Lipid panel  3. COVID-19 vaccination declined Declines covid 19 vaccine. Discussed risk of covid 82 and if she changes her mind about the vaccine to call the office.  Encouraged to take multivitamin, vitamin d, vitamin c and zinc to increase immune system. Aware can call office if would like to have vaccine here at office.   4. Persistent cough She has been referred to a lung specialist.  Continues to have a cough   Patient was given opportunity to ask questions. Patient verbalized understanding of the plan and was able to repeat key elements  of the plan. All questions were answered to their satisfaction.   Dana Brine, FNP   I, Dana Brine, FNP, have reviewed all documentation for this visit. The documentation on 06/22/21 for the exam, diagnosis, procedures, and orders are all accurate and complete.  THE PATIENT IS ENCOURAGED TO PRACTICE SOCIAL DISTANCING DUE TO THE COVID-19 PANDEMIC.

## 2021-05-26 NOTE — Patient Instructions (Signed)
Health Maintenance, Female Adopting a healthy lifestyle and getting preventive care are important in promoting health and wellness. Ask your health care provider about: The right schedule for you to have regular tests and exams. Things you can do on your own to prevent diseases and keep yourself healthy. What should I know about diet, weight, and exercise? Eat a healthy diet  Eat a diet that includes plenty of vegetables, fruits, low-fat dairy products, and lean protein. Do not eat a lot of foods that are high in solid fats, added sugars, or sodium.  Maintain a healthy weight Body mass index (BMI) is used to identify weight problems. It estimates body fat based on height and weight. Your health care provider can help determineyour BMI and help you achieve or maintain a healthy weight. Get regular exercise Get regular exercise. This is one of the most important things you can do for your health. Most adults should: Exercise for at least 150 minutes each week. The exercise should increase your heart rate and make you sweat (moderate-intensity exercise). Do strengthening exercises at least twice a week. This is in addition to the moderate-intensity exercise. Spend less time sitting. Even light physical activity can be beneficial. Watch cholesterol and blood lipids Have your blood tested for lipids and cholesterol at 26 years of age, then havethis test every 5 years. Have your cholesterol levels checked more often if: Your lipid or cholesterol levels are high. You are older than 26 years of age. You are at high risk for heart disease. What should I know about cancer screening? Depending on your health history and family history, you may need to have cancer screening at various ages. This may include screening for: Breast cancer. Cervical cancer. Colorectal cancer. Skin cancer. Lung cancer. What should I know about heart disease, diabetes, and high blood pressure? Blood pressure and heart  disease High blood pressure causes heart disease and increases the risk of stroke. This is more likely to develop in people who have high blood pressure readings, are of African descent, or are overweight. Have your blood pressure checked: Every 3-5 years if you are 18-39 years of age. Every year if you are 40 years old or older. Diabetes Have regular diabetes screenings. This checks your fasting blood sugar level. Have the screening done: Once every three years after age 40 if you are at a normal weight and have a low risk for diabetes. More often and at a younger age if you are overweight or have a high risk for diabetes. What should I know about preventing infection? Hepatitis B If you have a higher risk for hepatitis B, you should be screened for this virus. Talk with your health care provider to find out if you are at risk forhepatitis B infection. Hepatitis C Testing is recommended for: Everyone born from 1945 through 1965. Anyone with known risk factors for hepatitis C. Sexually transmitted infections (STIs) Get screened for STIs, including gonorrhea and chlamydia, if: You are sexually active and are younger than 26 years of age. You are older than 26 years of age and your health care provider tells you that you are at risk for this type of infection. Your sexual activity has changed since you were last screened, and you are at increased risk for chlamydia or gonorrhea. Ask your health care provider if you are at risk. Ask your health care provider about whether you are at high risk for HIV. Your health care provider may recommend a prescription medicine to help   prevent HIV infection. If you choose to take medicine to prevent HIV, you should first get tested for HIV. You should then be tested every 3 months for as long as you are taking the medicine. Pregnancy If you are about to stop having your period (premenopausal) and you may become pregnant, seek counseling before you get  pregnant. Take 400 to 800 micrograms (mcg) of folic acid every day if you become pregnant. Ask for birth control (contraception) if you want to prevent pregnancy. Osteoporosis and menopause Osteoporosis is a disease in which the bones lose minerals and strength with aging. This can result in bone fractures. If you are 65 years old or older, or if you are at risk for osteoporosis and fractures, ask your health care provider if you should: Be screened for bone loss. Take a calcium or vitamin D supplement to lower your risk of fractures. Be given hormone replacement therapy (HRT) to treat symptoms of menopause. Follow these instructions at home: Lifestyle Do not use any products that contain nicotine or tobacco, such as cigarettes, e-cigarettes, and chewing tobacco. If you need help quitting, ask your health care provider. Do not use street drugs. Do not share needles. Ask your health care provider for help if you need support or information about quitting drugs. Alcohol use Do not drink alcohol if: Your health care provider tells you not to drink. You are pregnant, may be pregnant, or are planning to become pregnant. If you drink alcohol: Limit how much you use to 0-1 drink a day. Limit intake if you are breastfeeding. Be aware of how much alcohol is in your drink. In the [place] one drink equals one 12 oz bottle of beer (355 mL), one 5 oz glass of wine (148 mL), or one 1 oz glass of hard liquor (44 mL). General instructions Schedule regular health, dental, and eye exams. Stay current with your vaccines. Tell your health care provider if: You often feel depressed. You have ever been abused or do not feel safe at home. Summary Adopting a healthy lifestyle and getting preventive care are important in promoting health and wellness. Follow your health care provider's instructions about healthy diet, exercising, and getting tested or screened for diseases. Follow your health care provider's  instructions on monitoring your cholesterol and blood pressure. This information is not intended to replace advice given to you by your health care provider. Make sure you discuss any questions you have with your healthcare provider. Document Revised: 11/06/2018 Document Reviewed: 11/06/2018 Elsevier Patient Education  2022 [employer]  

## 2021-06-20 ENCOUNTER — Telehealth: Payer: Self-pay

## 2021-06-20 NOTE — Telephone Encounter (Signed)
I called and left pt vm to call the office to let us know when she can come to do her labs that she forgot to do at her last office visit. YL,RMA

## 2021-06-23 LAB — CMP14+EGFR
ALT: 17 IU/L (ref 0–32)
AST: 18 IU/L (ref 0–40)
Albumin/Globulin Ratio: 1.9 (ref 1.2–2.2)
Albumin: 4.5 g/dL (ref 3.9–5.0)
Alkaline Phosphatase: 105 IU/L (ref 44–121)
BUN/Creatinine Ratio: 10 (ref 9–23)
BUN: 8 mg/dL (ref 6–20)
Bilirubin Total: 0.3 mg/dL (ref 0.0–1.2)
CO2: 23 mmol/L (ref 20–29)
Calcium: 9.2 mg/dL (ref 8.7–10.2)
Chloride: 102 mmol/L (ref 96–106)
Creatinine, Ser: 0.81 mg/dL (ref 0.57–1.00)
Globulin, Total: 2.4 g/dL (ref 1.5–4.5)
Glucose: 100 mg/dL — ABNORMAL HIGH (ref 65–99)
Potassium: 4.3 mmol/L (ref 3.5–5.2)
Sodium: 140 mmol/L (ref 134–144)
Total Protein: 6.9 g/dL (ref 6.0–8.5)
eGFR: 103 mL/min/{1.73_m2} (ref >59)

## 2021-06-23 LAB — LIPID PANEL
Chol/HDL Ratio: 3.6 ratio (ref 0.0–4.4)
Cholesterol, Total: 182 mg/dL (ref 100–199)
HDL: 50 mg/dL (ref >39)
LDL Chol Calc (NIH): 120 mg/dL — ABNORMAL HIGH (ref 0–99)
Triglycerides: 63 mg/dL (ref 0–149)
VLDL Cholesterol Cal: 12 mg/dL (ref 5–40)

## 2021-07-25 ENCOUNTER — Ambulatory Visit (INDEPENDENT_AMBULATORY_CARE_PROVIDER_SITE_OTHER): Payer: 59 | Admitting: Pulmonary Disease

## 2021-07-25 ENCOUNTER — Encounter: Payer: Self-pay | Admitting: Pulmonary Disease

## 2021-07-25 ENCOUNTER — Other Ambulatory Visit: Payer: Self-pay

## 2021-07-25 VITALS — BP 126/84 | HR 72 | Ht 62.0 in | Wt 247.8 lb

## 2021-07-25 DIAGNOSIS — R053 Chronic cough: Secondary | ICD-10-CM | POA: Diagnosis not present

## 2021-07-25 DIAGNOSIS — J4540 Moderate persistent asthma, uncomplicated: Secondary | ICD-10-CM

## 2021-07-25 LAB — CBC WITH DIFFERENTIAL/PLATELET
Basophils Absolute: 0 10*3/uL (ref 0.0–0.1)
Basophils Relative: 0.4 % (ref 0.0–3.0)
Eosinophils Absolute: 0.1 10*3/uL (ref 0.0–0.7)
Eosinophils Relative: 1.5 % (ref 0.0–5.0)
HCT: 41.3 % (ref 36.0–46.0)
Hemoglobin: 13.6 g/dL (ref 12.0–15.0)
Lymphocytes Relative: 30 % (ref 12.0–46.0)
Lymphs Abs: 1.7 10*3/uL (ref 0.7–4.0)
MCHC: 32.8 g/dL (ref 30.0–36.0)
MCV: 85.2 fl (ref 78.0–100.0)
Monocytes Absolute: 0.4 10*3/uL (ref 0.1–1.0)
Monocytes Relative: 7.7 % (ref 3.0–12.0)
Neutro Abs: 3.5 10*3/uL (ref 1.4–7.7)
Neutrophils Relative %: 60.4 % (ref 43.0–77.0)
Platelets: 230 10*3/uL (ref 150.0–400.0)
RBC: 4.85 Mil/uL (ref 3.87–5.11)
RDW: 14 % (ref 11.5–15.5)
WBC: 5.8 10*3/uL (ref 4.0–10.5)

## 2021-07-25 MED ORDER — BREZTRI AEROSPHERE 160-9-4.8 MCG/ACT IN AERO: 2.0000 | INHALATION_SPRAY | Freq: Two times a day (BID) | RESPIRATORY_TRACT | 0 refills | Status: DC

## 2021-07-25 NOTE — Progress Notes (Signed)
@Patient  ID: , female    DOB: [birth year], 25 y.o.   MRN: [id number]  Chief Complaint  Patient presents with   Consult    Referred by PCP for chronic cough for the past 8 months. States the cough at times will make her nauseated. Has tried different inhalers with no relief.     Referring provider: [id number]*  HPI:   25 y.o. whom we are seeing in consultation for evaluation of chronic cough.  PCP note x2 reviewed.  Most recent allergy/immunology note reviewed.  She reports cough for about 8 months.  Severe throughout the day and at night.  No timing or things are better or worse.  No position when things are better or worse.  In the past, would cough violently to the point of posttussive emesis.  Intermittently productive, often dry.  She attributes onset of cough after moving to a new office.  She moved to the new office a few months prior to onset of cough.  Historically, she will get coughing fits of bronchitis every winter with seasonal changes.  She notes that [place] occur in the winter.  With change of environment.  However, this cough is persisting and much more severe.  She used Symbicort early in the course without much improvement.  She was on Flovent without much perceived improvement.  Albuterol does not seem to help.  In fact it seems to cause paradoxical cough.  She was put on high-dose air duo about 2 months ago.  She states that the severity of cough seems to be a bit better as she went from vomiting up to 4 times a day to maybe only having 2 episodes of emesis over the last 2 months.  However, the cough is still very disruptive and persistent.  She notes she got a shot of Kenalog from her PCP couple months ago and this did help the cough significantly but only for couple of days.  She denies reflux symptoms.  No heartburn.  She denies sinus congestion, nasal congestion, postnasal  drip.  She denies any dyspnea or shortness of breath.  She was given albuterol inhaler as a child but never formally diagnosed with asthma.  She said she did not use it very much if at all.  Chest x-ray 12/2020 reviewed and interpreted as clear lungs bilaterally.  Spirometry 01/2021 reviewed interpreted as normal spirometry.  No bronchodilator was administered.  PMH: Seasonal allergies/allergic rhinitis, seasonal allergies Surgical history:  Family history: Mother with diabetes, CAD, hypertension, father with substance abuse Social history: Never smoker, lives in [place]   Questionaires / Pulmonary Flowsheets:   ACT:  Asthma Control Test ACT Total Score  05/25/2021 18    MMRC: mMRC Dyspnea Scale mMRC Score  07/25/2021 0    Epworth:  No flowsheet data found.  Tests:   FENO:  No results found for: NITRICOXIDE  PFT: 01/2021 - normal spirometry  WALK:  No flowsheet data found.  Imaging: Personally reviewed as per EMR discussion this note  Lab Results: Personally reviewed, no anemia  CBC    Component Value Date/Time   WBC 5.4 01/03/2021 1509   RBC 5.15 01/03/2021 1509   HGB 14.6 01/03/2021 1509   HCT 44.5 01/03/2021 1509   PLT 281 01/03/2021 1509   MCV 86 01/03/2021 1509   MCH 28.3 01/03/2021 1509   MCHC 32.8 01/03/2021 1509   RDW 12.7 01/03/2021 1509  BMET    Component Value Date/Time   NA 140 06/23/2021 1055   K 4.3 06/23/2021 1055   CL 102 06/23/2021 1055   CO2 23 06/23/2021 1055   GLUCOSE 100 (H) 06/23/2021 1055   BUN 8 06/23/2021 1055   CREATININE 0.81 06/23/2021 1055   CALCIUM 9.2 06/23/2021 1055   GFRNONAA 94 01/03/2021 1509   GFRAA 109 01/03/2021 1509    BNP No results found for: BNP  ProBNP No results found for: PROBNP  Specialty Problems   None  Allergies  Allergen Reactions   Amoxicillin Anaphylaxis   Penicillins Anaphylaxis    There is no immunization history for the selected administration types on file for this  patient.  Past Medical History:  Diagnosis Date   Recurrent upper respiratory infection (URI)     Tobacco History: Social History   Tobacco Use  Smoking Status Never  Smokeless Tobacco Never   Counseling given: Not Answered   Continue to not smoke  Outpatient Encounter Medications as of 07/25/2021  Medication Sig   Azelastine-Fluticasone 137-50 MCG/ACT SUSP Place 1 spray into the nose 2 (two) times daily.   Etonogestrel (NEXPLANON [place]) Inject into the skin.   fexofenadine (ALLEGRA) 180 MG tablet Take 1 tablet (180 mg total) by mouth daily.   Fluticasone-Salmeterol,sensor, (AIRDUO DIGIHALER) 232-14 MCG/ACT AEPB Inhale 1 puff into the lungs 2 (two) times daily.   No facility-administered encounter medications on file as of 07/25/2021.     Review of Systems  Review of Systems  No chest pain with exertion.  No orthopnea or PND.  Comprehensive review of systems otherwise negative. Physical Exam  BP 126/84 (BP Location: Right Arm, Patient Position: Sitting, Cuff Size: Large)   Pulse 72   Ht 5\' 2"  (1.575 m)   Wt 247 lb 12.8 oz (112.4 kg)   SpO2 99% Comment: on RA  BMI 45.32 kg/m   Wt Readings from Last 5 Encounters:  07/25/21 247 lb 12.8 oz (112.4 kg)  05/26/21 242 lb (109.8 kg)  03/17/21 237 lb (107.5 kg)  02/24/21 232 lb 12.8 oz (105.6 kg)  01/03/21 220 lb 12.8 oz (100.2 kg)    BMI Readings from Last 5 Encounters:  07/25/21 45.32 kg/m  05/26/21 44.26 kg/m  03/17/21 43.35 kg/m  02/24/21 42.58 kg/m  01/03/21 41.72 kg/m     Physical Exam General: Well-appearing, no distress Eyes: EOMI, no icterus Neck: Supple, no JVP appreciated Pulmonary: Clear to auscultation bilaterally, no wheezing, no work of breathing Cardiovascular: Regular rhythm, no murmurs Abdomen: Nondistended, bowel sounds present MSK: No synovitis, no joint effusion Neuro: Normal gait, no weakness Psych: Normal mood, full affect   Assessment & Plan:   Chronic cough: In the setting of  atopic symptoms, concern for asthma.  Prescribed high-dose air duo in June 2022 with improvement in frequency of posttussive emesis.  The likely severity of cough has improved although certainly very bothersome and significant from a symptom burden standpoint.  Steroid injections helped.  Albuterol causes worsening cough.  She denies postnasal drip, GERD.  Chest x-ray clear 12/2020.  Consider additional work-up, cross-sectional imaging if things not improving as below.  Asthma: Atopic symptoms, chronic cough, seasonal bronchitis in the past.  She had change in environment and new office a few months prior to onset of cough.  She cannot change in environment for her job.  Think it all fits with a diagnosis of asthma.  Escalate to [facility] therapy, trial with samples today.  If helps we will send in  prescription.  In addition, labs today including CBC with differential to evaluate for eosinophils, IgE, and RAST panel.  Consider Biologics in the future if not improving.   Return in about 8 weeks (around 09/19/2021).   Karren Burly, MD 07/25/2021

## 2021-07-25 NOTE — Patient Instructions (Signed)
Nice to meet you  I recommend trying a different inhaler, Breztri 2 puffs twice a day.  You not need to use the air duo while using the restroom.  I provided a 2-week sample.  Please let me know if this helps with the next several days.  If so, I will send in a new prescription for the new medication.  Please rinse out your mouth after every use.  I will get a get blood work today to test for markers of inflammation that may need more aggressive treatment for asthma.  I think that based on your story asthma is likely driving most of the cough.  Return to clinic in 8 weeks or sooner as needed

## 2021-07-26 LAB — IGE: IgE (Immunoglobulin E), Serum: 89 kU/L

## 2021-07-29 LAB — ALLERGEN PROFILE, PERENNIAL ALLERGEN IGE
Alternaria Alternata IgE: <0.1 kU/L
Aspergillus Fumigatus IgE: 0.3 kU/L — AB
Aureobasidi Pullulans IgE: <0.1 kU/L
Candida Albicans IgE: <0.1 kU/L
Cat Dander IgE: 0.47 kU/L — AB
Chicken Feathers IgE: <0.1 kU/L
Cladosporium Herbarum IgE: <0.1 kU/L
Cow Dander IgE: <0.1 kU/L
D Farinae IgE: 0.95 kU/L — AB
D Pteronyssinus IgE: 1.44 kU/L — AB
Dog Dander IgE: 0.23 kU/L — AB
Duck Feathers IgE: <0.1 kU/L
Goose Feathers IgE: <0.1 kU/L
Mouse Urine IgE: <0.1 kU/L
Mucor Racemosus IgE: <0.1 kU/L
Penicillium Chrysogen IgE: 0.16 kU/L — AB
Phoma Betae IgE: 0.43 kU/L — AB
Setomelanomma Rostrat: 0.31 kU/L — AB
Stemphylium Herbarum IgE: 0.28 kU/L — AB

## 2021-09-08 ENCOUNTER — Ambulatory Visit: Payer: 59 | Admitting: Allergy

## 2021-09-20 ENCOUNTER — Ambulatory Visit: Payer: 59 | Admitting: Pulmonary Disease

## 2022-05-01 ENCOUNTER — Encounter: Payer: Self-pay | Admitting: Nurse Practitioner

## 2022-05-03 ENCOUNTER — Ambulatory Visit: Payer: 59 | Admitting: Nurse Practitioner

## 2022-05-03 ENCOUNTER — Encounter: Payer: Self-pay | Admitting: Nurse Practitioner

## 2022-05-03 VITALS — BP 110/64 | HR 71 | Temp 98.8°F | Ht 62.0 in | Wt 262.0 lb

## 2022-05-03 DIAGNOSIS — H6123 Impacted cerumen, bilateral: Secondary | ICD-10-CM

## 2022-05-03 DIAGNOSIS — H9193 Unspecified hearing loss, bilateral: Secondary | ICD-10-CM | POA: Diagnosis not present

## 2022-05-03 DIAGNOSIS — H9201 Otalgia, right ear: Secondary | ICD-10-CM

## 2022-05-03 NOTE — Patient Instructions (Addendum)
Earache, Adult An earache, or ear pain, can be caused by many things, including: An infection. Ear wax buildup. Ear pressure. Something in the ear that should not be there (foreign body). A sore throat. Tooth problems. Jaw problems. Treatment of the earache will depend on the cause. If the cause is not clear or cannot be determined, you may need to watch your symptoms until your earache goes away or until a cause is found. Follow these instructions at home: Medicines Take or apply over-the-counter and prescription medicines only as told by your health care provider. If you were prescribed an antibiotic medicine, use it as told by your health care provider. Do not stop using the antibiotic even if you start to feel better. Do not put anything in your ear other than medicine that is prescribed by your health care provider. Managing pain If directed, apply heat to the affected area as often as told by your health care provider. Use the heat source that your health care provider recommends, such as a moist heat pack or a heating pad. Place a towel between your skin and the heat source. Leave the heat on for 20-30 minutes. Remove the heat if your skin turns bright red. This is especially important if you are unable to feel pain, heat, or cold. You may have a greater risk of getting burned. If directed, put ice on the affected area as often as told by your health care provider. To do this:     Put ice in a plastic bag. Place a towel between your skin and the bag. Leave the ice on for 20 minutes, 2-3 times a day. General instructions Pay attention to any changes in your symptoms. Try resting in an upright position instead of lying down. This may help to reduce pressure in your ear and relieve pain. Chew gum if it helps to relieve your ear pain. Treat any allergies as told by your health care provider. Drink enough fluid to keep your urine pale yellow. It is up to you to get the results of  any tests that were done. Ask your health care provider, or the department that is doing the tests, when your results will be ready. Keep all follow-up visits as told by your health care provider. This is important. Contact a health care provider if: Your pain does not improve within 2 days. Your earache gets worse. You have new symptoms. You have a fever. Get help right away if you: Have a severe headache. Have a stiff neck. Have trouble swallowing. Have redness or swelling behind your ear. Have fluid or blood coming from your ear. Have hearing loss. Feel dizzy. Summary An earache, or ear pain, can be caused by many things. Treatment of the earache will depend on the cause. Follow recommendations from your health care provider to treat your ear pain. If the cause is not clear or cannot be determined, you may need to watch your symptoms until your earache goes away or until a cause is found. Keep all follow-up visits as told by your health care provider. This is important. This information is not intended to replace advice given to you by your health care provider. Make sure you discuss any questions you have with your health care provider. Document Revised: 06/20/2019 Document Reviewed: 06/21/2019 Elsevier Patient Education  2023 [employer] Inc.   Earwax Buildup, Adult The ears produce a substance called earwax that helps keep bacteria out of the ear and protects the skin in the ear canal.  Occasionally, earwax can build up in the ear and cause discomfort or hearing loss. What are the causes? This condition is caused by a buildup of earwax. Ear canals are self-cleaning. Ear wax is made in the outer part of the ear canal and generally falls out in small amounts over time. When the self-cleaning mechanism is not working, earwax builds up and can cause decreased hearing and discomfort. Attempting to clean ears with cotton swabs can push the earwax deep into the ear canal and cause decreased  hearing and pain. What increases the risk? This condition is more likely to develop in people who: Clean their ears often with cotton swabs. Pick at their ears. Use earplugs or in-ear headphones often, or wear hearing aids. The following factors may also make you more likely to develop this condition: Being female. Being of older age. Naturally producing more earwax. Having narrow ear canals. Having earwax that is overly thick or sticky. Having excess hair in the ear canal. Having eczema. Being dehydrated. What are the signs or symptoms? Symptoms of this condition include: Reduced or muffled hearing. A feeling of fullness in the ear or feeling that the ear is plugged. Fluid coming from the ear. Ear pain or an itchy ear. Ringing in the ear. Coughing. Balance problems. An obvious piece of earwax that can be seen inside the ear canal. How is this diagnosed? This condition may be diagnosed based on: Your symptoms. Your medical history. An ear exam. During the exam, your health care provider will look into your ear with an instrument called an otoscope. You may have tests, including a hearing test. How is this treated? This condition may be treated by: Using ear drops to soften the earwax. Having the earwax removed by a health care provider. The health care provider may: Flush the ear with water. Use an instrument that has a loop on the end (curette). Use a suction device. Having surgery to remove the wax buildup. This may be done in severe cases. Follow these instructions at home:  Take over-the-counter and prescription medicines only as told by your health care provider. Do not put any objects, including cotton swabs, into your ear. You can clean the opening of your ear canal with a washcloth or facial tissue. Follow instructions from your health care provider about cleaning your ears. Do not overclean your ears. Drink enough fluid to keep your urine pale yellow. This will help  to thin the earwax. Keep all follow-up visits as told. If earwax builds up in your ears often or if you use hearing aids, consider seeing your health care provider for routine, preventive ear cleanings. Ask your health care provider how often you should schedule your cleanings. If you have hearing aids, clean them according to instructions from the manufacturer and your health care provider. Contact a health care provider if: You have ear pain. You develop a fever. You have pus or other fluid coming from your ear. You have hearing loss. You have ringing in your ears that does not go away. You feel like the room is spinning (vertigo). Your symptoms do not improve with treatment. Get help right away if: You have bleeding from the affected ear. You have severe ear pain. Summary Earwax can build up in the ear and cause discomfort or hearing loss. The most common symptoms of this condition include reduced or muffled hearing, a feeling of fullness in the ear, or feeling that the ear is plugged. This condition may be diagnosed based on  your symptoms, your medical history, and an ear exam. This condition may be treated by using ear drops to soften the earwax or by having the earwax removed by a health care provider. Do not put any objects, including cotton swabs, into your ear. You can clean the opening of your ear canal with a washcloth or facial tissue. This information is not intended to replace advice given to you by your health care provider. Make sure you discuss any questions you have with your health care provider. Document Revised: 03/02/2020 Document Reviewed: 03/02/2020 Elsevier Patient Education  2023 [employer].

## 2022-05-03 NOTE — Progress Notes (Signed)
I,[name],acting as a Neurosurgeon for [employer], FNP.,have documented all relevant documentation on the behalf of Arnette Felts, FNP,as directed by  Arnette Felts, FNP while in the presence of Arnette Felts, FNP.  This visit occurred during the SARS-CoV-2 public health emergency.  Safety protocols were in place, including screening questions prior to the visit, additional usage of staff PPE, and extensive cleaning of exam room while observing appropriate contact time as indicated for disinfecting solutions.  Subjective:     Patient ID: Dana Galvan , female    DOB: Aug 02, 1996 , 26 y.o.   MRN: [id number]   Chief Complaint  Patient presents with   Otalgia    HPI  Patient presents today for ear pain. She would like her ears cleaned and his having hearing loss. She will have her head phones on high levels and still have trouble hearing  Otalgia  There is pain in the right ear. This is a new problem. The pain is at a severity of 3/10. The pain is mild. Associated symptoms include hearing loss (for more than one year). She has tried ear drops (peroxide, sweet oil) for the symptoms.    Past Medical History:  Diagnosis Date   Recurrent upper respiratory infection (URI)      Family History  Problem Relation Age of Onset   Diabetes Mother    Heart disease Mother    Carpal tunnel syndrome Mother    Hypertension Mother    Drug abuse Mother    Alcohol abuse Mother    Drug abuse Father    Alcohol abuse Father      Current Outpatient Medications:    Azelastine-Fluticasone 137-50 MCG/ACT SUSP, Place 1 spray into the nose 2 (two) times daily., Disp: 23 g, Rfl: 2   Budeson-Glycopyrrol-Formoterol (BREZTRI AEROSPHERE) 160-9-4.8 MCG/ACT AERO, Inhale 2 puffs into the lungs in the morning and at bedtime., Disp: 11.8 g, Rfl: 0   Etonogestrel (NEXPLANON [place]), Inject into the skin., Disp: , Rfl:    fexofenadine (ALLEGRA) 180 MG tablet, Take 1 tablet (180 mg total) by mouth daily., Disp: 90 tablet,  Rfl: 3   Fluticasone-Salmeterol,sensor, (AIRDUO DIGIHALER) 232-14 MCG/ACT AEPB, Inhale 1 puff into the lungs 2 (two) times daily., Disp: 1 each, Rfl: 2   Allergies  Allergen Reactions   Amoxicillin Anaphylaxis   Penicillins Anaphylaxis     Review of Systems  Constitutional: Negative.   HENT:  Positive for ear pain and hearing loss (for more than one year).   Respiratory: Negative.    Cardiovascular: Negative.   Gastrointestinal: Negative.   Neurological: Negative.   Psychiatric/Behavioral: Negative.      Today's Vitals   05/03/22 1540  BP: 110/64  Pulse: 71  Temp: 98.8 F (37.1 C)  TempSrc: Oral  Weight: 262 lb (118.8 kg)  Height: 5\' 2"  (1.575 m)   Body mass index is 47.92 kg/m.  Wt Readings from Last 3 Encounters:  05/03/22 262 lb (118.8 kg)  07/25/21 247 lb 12.8 oz (112.4 kg)  05/26/21 242 lb (109.8 kg)    Objective:  Physical Exam Vitals reviewed.  Constitutional:      General: She is not in acute distress.    Appearance: Normal appearance.  HENT:     Right Ear: External ear normal. There is impacted cerumen (worse than left - firm cerumen).     Left Ear: External ear normal. There is impacted cerumen.  Pulmonary:     Effort: Pulmonary effort is normal. No respiratory distress.  Neurological:  General: No focal deficit present.     Mental Status: She is alert and oriented to person, place, and time.     Cranial Nerves: No cranial nerve deficit.  Psychiatric:        Mood and Affect: Mood normal.        Behavior: Behavior normal.        Thought Content: Thought content normal.        Judgment: Judgment normal.        Assessment And Plan:     1. Right ear pain Comments: Likely related to cerumen impaction - Ear Lavage  2. Bilateral impacted cerumen Wax is removed by with lavage with elephant ear with 1/2 water and 1/2 peroxide. Instructions for home care to prevent wax buildup are given. - Ear Lavage  3. Bilateral hearing loss, unspecified  hearing loss type Comments: After ear lavage patient was able to hear more clearly.      Patient was given opportunity to ask questions. Patient verbalized understanding of the plan and was able to repeat key elements of the plan. All questions were answered to their satisfaction.  Arnette Felts, FNP   I, Arnette Felts, FNP, have reviewed all documentation for this visit. The documentation on 05/03/22 for the exam, diagnosis, procedures, and orders are all accurate and complete.   IF YOU HAVE BEEN REFERRED TO A SPECIALIST, IT MAY TAKE 1-2 WEEKS TO SCHEDULE/PROCESS THE REFERRAL. IF YOU HAVE NOT HEARD FROM US/SPECIALIST IN TWO WEEKS, PLEASE GIVE [place] A CALL AT [phone].   THE PATIENT IS ENCOURAGED TO PRACTICE SOCIAL DISTANCING DUE TO THE COVID-19 PANDEMIC.

## 2022-06-20 IMAGING — CR DG CHEST 2V
2 series · 2 of 2 positions shown · non-contrast
Comparison: None.

CLINICAL DATA: Persistent cough for 1 month, shortness of breath

EXAM:
CHEST - 2 VIEW

[w chest pa]
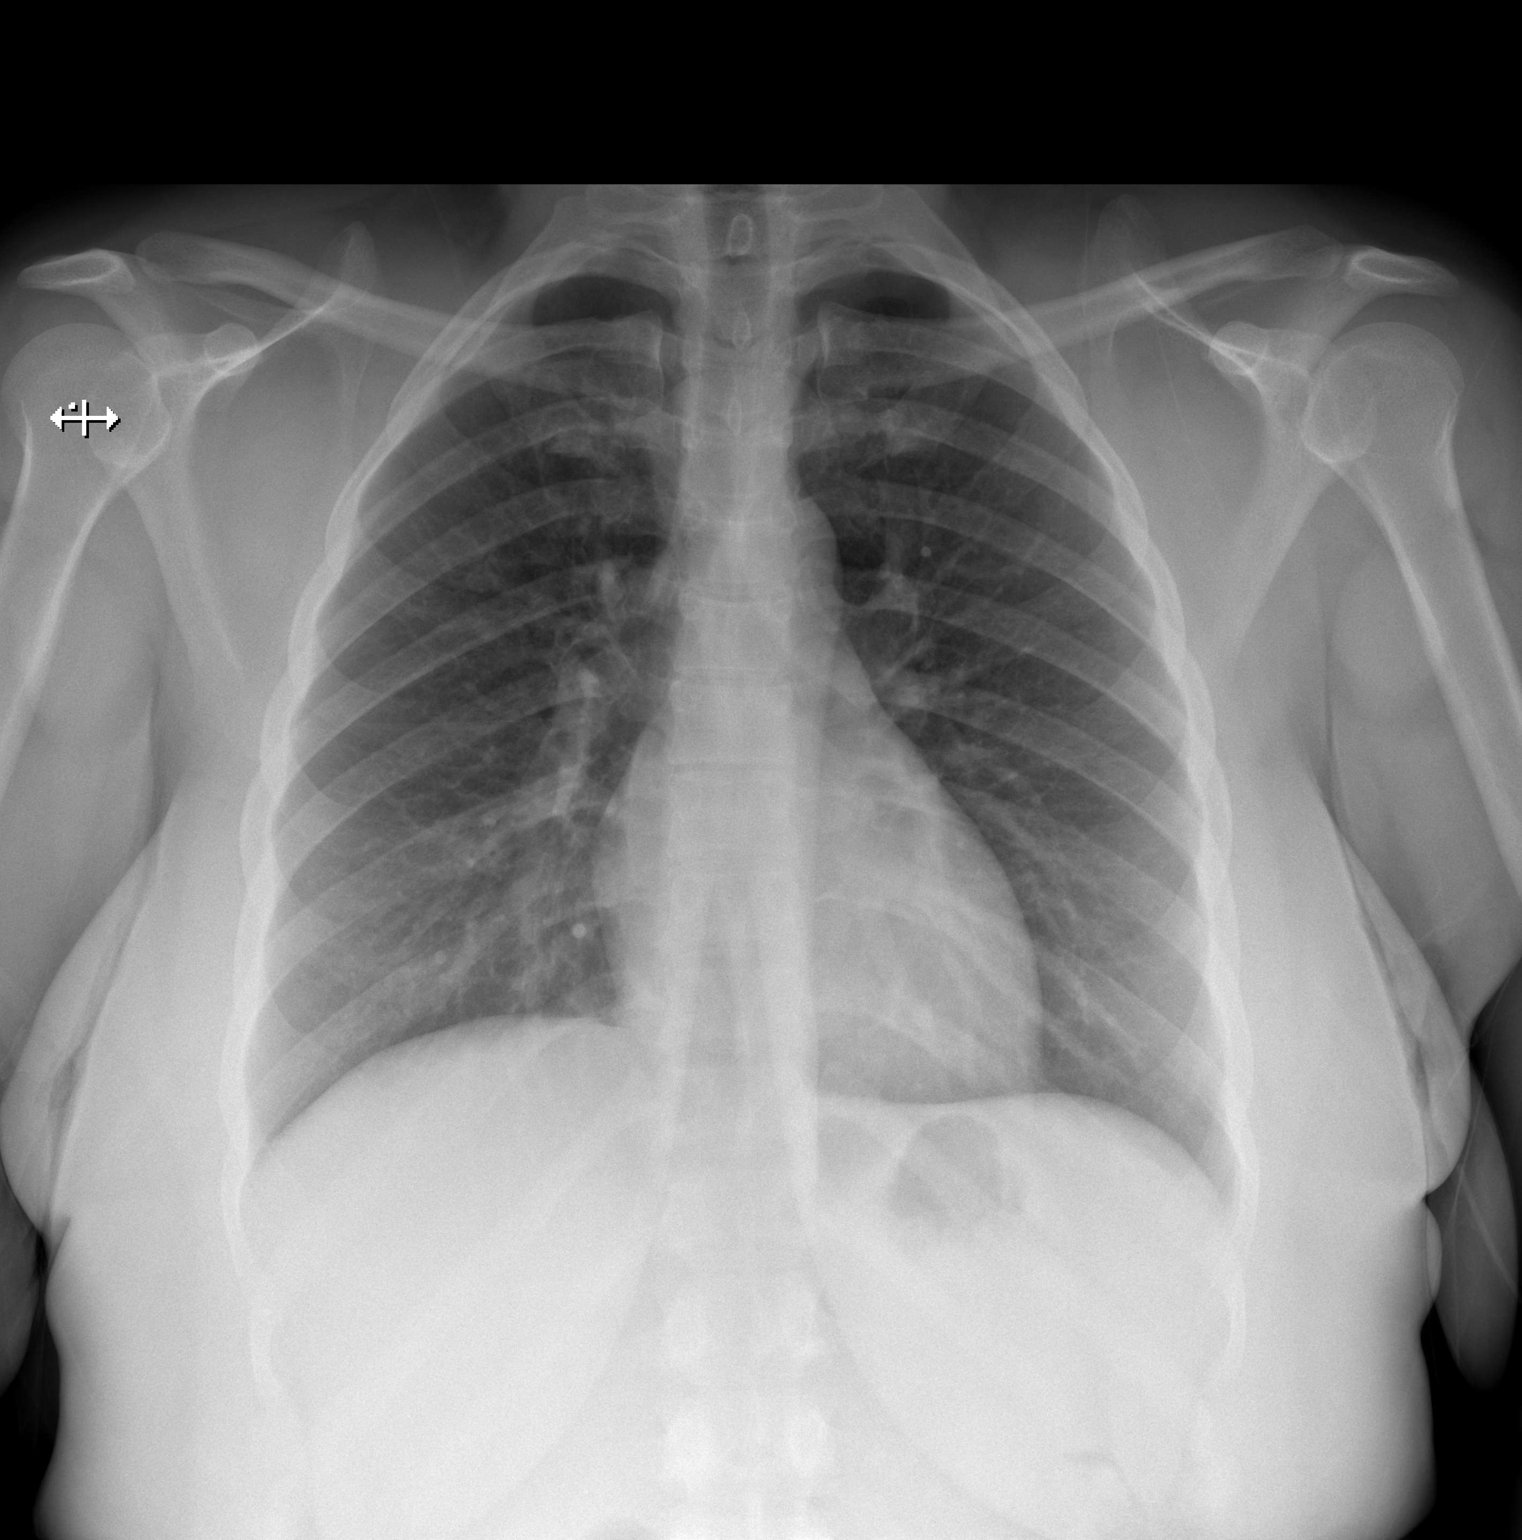

[w chest lat]
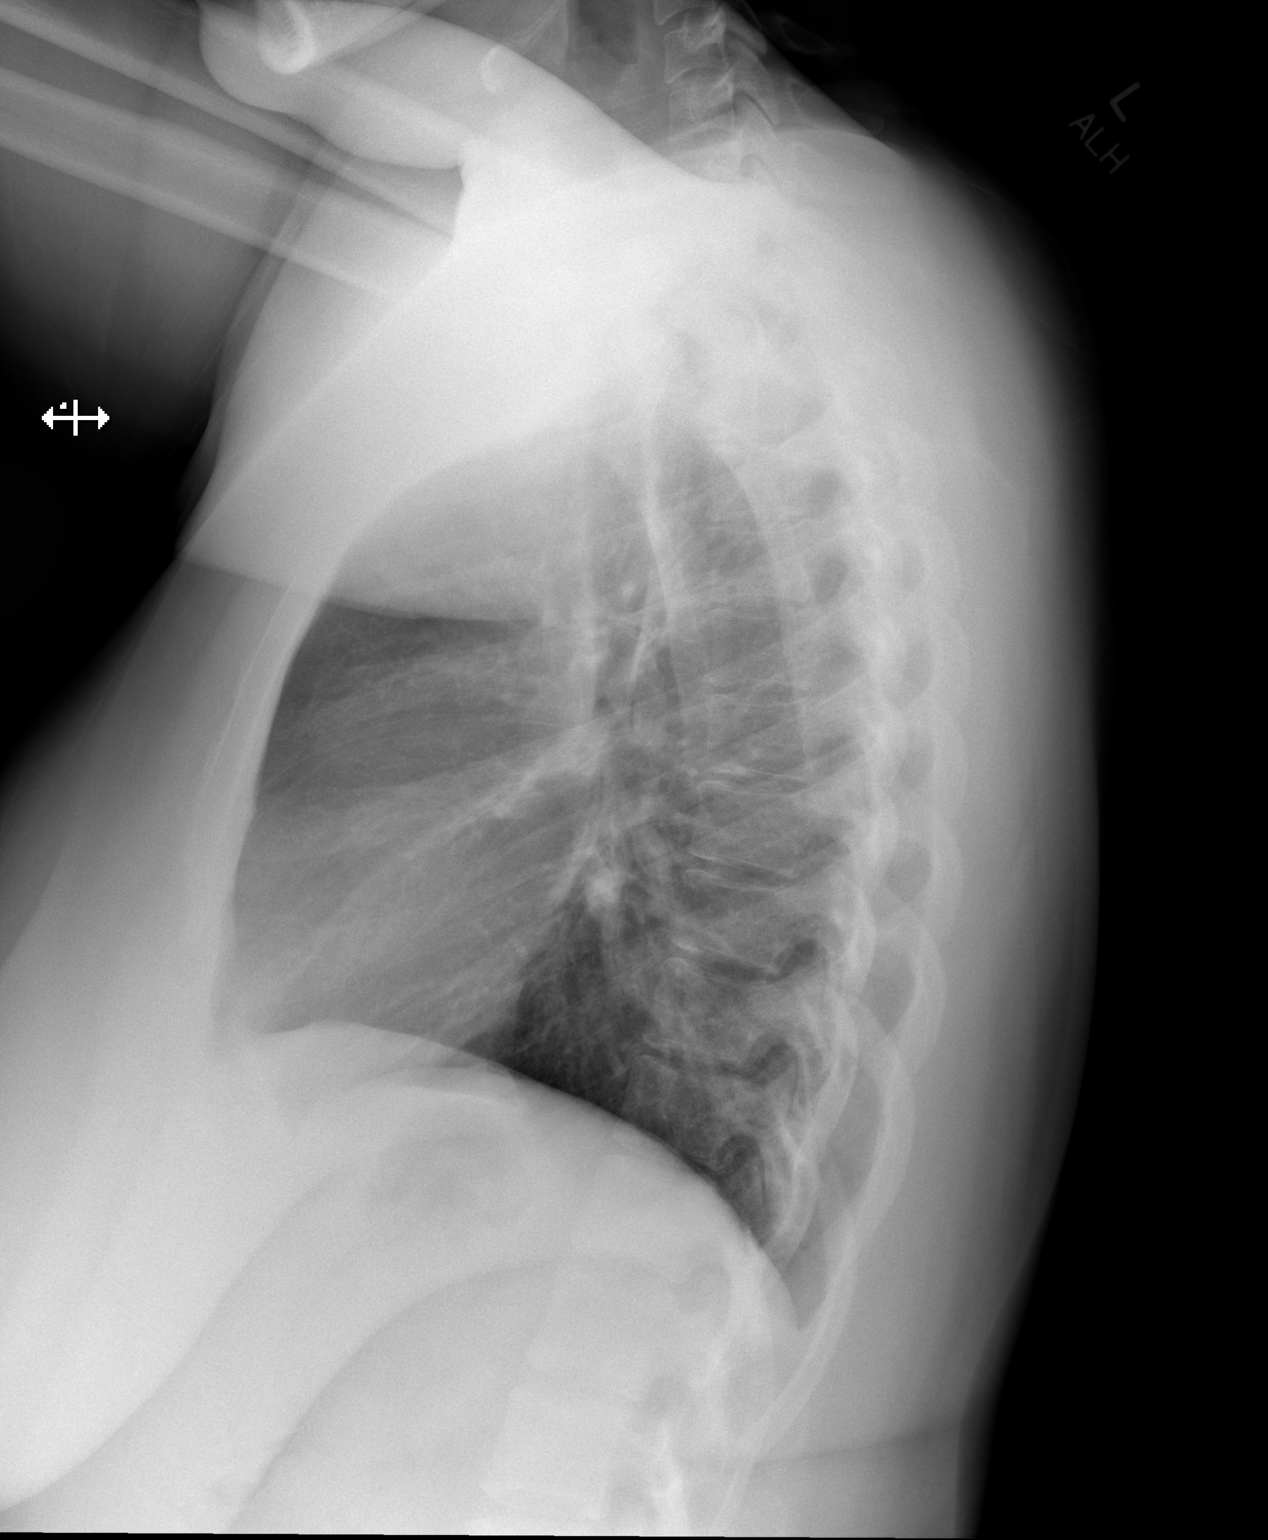

[2 of 2 positions shown; findings below may reference images not displayed]

FINDINGS: The heart size and mediastinal contours are within normal limits.
Both lungs are clear. The visualized skeletal structures are
unremarkable.
IMPRESSION: No active cardiopulmonary disease.

## 2022-06-27 ENCOUNTER — Ambulatory Visit (INDEPENDENT_AMBULATORY_CARE_PROVIDER_SITE_OTHER): Payer: 59 | Admitting: Nurse Practitioner

## 2022-06-27 ENCOUNTER — Encounter: Payer: Self-pay | Admitting: Nurse Practitioner

## 2022-06-27 VITALS — BP 118/82 | HR 74 | Temp 98.1°F | Ht 62.0 in | Wt 265.6 lb

## 2022-06-27 DIAGNOSIS — Z0001 Encounter for general adult medical examination with abnormal findings: Secondary | ICD-10-CM

## 2022-06-27 DIAGNOSIS — H6123 Impacted cerumen, bilateral: Secondary | ICD-10-CM | POA: Diagnosis not present

## 2022-06-27 DIAGNOSIS — E7800 Pure hypercholesterolemia, unspecified: Secondary | ICD-10-CM

## 2022-06-27 DIAGNOSIS — Z0000 Encounter for general adult medical examination without abnormal findings: Secondary | ICD-10-CM

## 2022-06-27 DIAGNOSIS — Z6841 Body Mass Index (BMI) 40.0 and over, adult: Secondary | ICD-10-CM

## 2022-06-27 MED ORDER — SAXENDA 18 MG/3ML ~~LOC~~ SOPN: 3.0000 mg | PEN_INJECTOR | Freq: Every day | SUBCUTANEOUS | 1 refills | Status: DC

## 2022-06-27 NOTE — Progress Notes (Signed)
I,Victoria T Hamilton,acting as a Education administrator for [name], FNP.,have documented all relevant documentation on the behalf of [name], FNP,as directed by  [name], FNP while in the presence of [name], [place].   Subjective:     Patient ID: Dana Galvan , female    DOB: 1995/12/09 , 26 y.o.   MRN: [id number]   Chief Complaint  Patient presents with   Annual Exam    HPI  Patient is here for physical exam. She is followed by [facility] for her GYN care.   Wt Readings from Last 3 Encounters: 06/27/22 : 265 lb 9.6 oz (120.5 kg) 05/03/22 : 262 lb (118.8 kg) 07/25/21 : 247 lb 12.8 oz (112.4 kg)  She has tried phentermine and HCG injections which were effective at [facility], she was paying $20 weekly.      Past Medical History:  Diagnosis Date   Recurrent upper respiratory infection (URI)      Family History  Problem Relation Age of Onset   Diabetes Mother    Heart disease Mother    Carpal tunnel syndrome Mother    Hypertension Mother    Drug abuse Mother    Alcohol abuse Mother    Drug abuse Father    Alcohol abuse Father      Current Outpatient Medications:    Etonogestrel (NEXPLANON [place]), Inject into the skin., Disp: , Rfl:    Fluticasone-Salmeterol,sensor, (AIRDUO DIGIHALER) 232-14 MCG/ACT AEPB, Inhale 1 puff into the lungs 2 (two) times daily., Disp: 1 each, Rfl: 2   Liraglutide -Weight Management (SAXENDA) 18 MG/3ML SOPN, Inject 3 mg into the skin daily., Disp: 15 mL, Rfl: 1   Azelastine-Fluticasone 137-50 MCG/ACT SUSP, Place 1 spray into the nose 2 (two) times daily. (Patient not taking: Reported on 06/27/2022), Disp: 23 g, Rfl: 2   Budeson-Glycopyrrol-Formoterol (BREZTRI AEROSPHERE) 160-9-4.8 MCG/ACT AERO, Inhale 2 puffs into the lungs in the morning and at bedtime., Disp: 11.8 g, Rfl: 0   fexofenadine (ALLEGRA) 180 MG tablet, Take 1 tablet (180 mg total) by mouth daily. (Patient not taking: Reported on 06/27/2022), Disp: 90 tablet, Rfl: 3   Allergies   Allergen Reactions   Amoxicillin Anaphylaxis   Penicillins Anaphylaxis      The patient states she uses Nexplanon for birth control.  No LMP recorded. Patient has had an implant.. Negative for Dysmenorrhea and Negative for Menorrhagia. Negative for: breast discharge, breast lump(s), breast pain and breast self exam. Associated symptoms include abnormal vaginal bleeding. Pertinent negatives include abnormal bleeding (hematology), anxiety, decreased libido, depression, difficulty falling sleep, dyspareunia, history of infertility, nocturia, sexual dysfunction, sleep disturbances, urinary incontinence, urinary urgency, vaginal discharge and vaginal itching. Diet regular; feels like it is terrible - sugar and snacks but is meal prepping. The patient states her exercise level is minimal with 30 minutes a day.   The patient's tobacco use is:  Social History   Tobacco Use  Smoking Status Never  Smokeless Tobacco Never   She has been exposed to passive smoke. The patient's alcohol use is:  Social History   Substance and Sexual Activity  Alcohol Use Yes   Comment: occasionally    Additional information: Last pap 09/08/2020, next one scheduled for 09/09/2023.    Review of Systems  Constitutional: Negative.   Respiratory: Negative.    Cardiovascular: Negative.   Neurological: Negative.   Psychiatric/Behavioral: Negative.       Today's Vitals   06/27/22 1412  BP: 118/82  Pulse: 74  Temp: 98.1  F (36.7 C)  SpO2: 98%  Weight: 265 lb 9.6 oz (120.5 kg)  Height: '5\' 2"'  (1.575 m)  PainSc: 0-No pain   Body mass index is 48.58 kg/m.  Wt Readings from Last 3 Encounters:  06/27/22 265 lb 9.6 oz (120.5 kg)  05/03/22 262 lb (118.8 kg)  07/25/21 247 lb 12.8 oz (112.4 kg)    Objective:  Physical Exam Vitals reviewed.  Constitutional:      General: She is not in acute distress.    Appearance: Normal appearance. She is well-developed. She is obese.  HENT:     Head: Normocephalic and  atraumatic.     Right Ear: Hearing and external ear normal. There is impacted cerumen.     Left Ear: Hearing and external ear normal. There is impacted cerumen.     Nose: Nose normal.     Mouth/Throat:     Mouth: Mucous membranes are moist.  Eyes:     General: Lids are normal.     Extraocular Movements: Extraocular movements intact.     Conjunctiva/sclera: Conjunctivae normal.     Pupils: Pupils are equal, round, and reactive to light.     Funduscopic exam:    Right eye: No papilledema.        Left eye: No papilledema.  Neck:     Thyroid: No thyroid mass.     Vascular: No carotid bruit.  Cardiovascular:     Rate and Rhythm: Normal rate and regular rhythm.     Pulses: Normal pulses.     Heart sounds: Normal heart sounds. No murmur heard. Pulmonary:     Effort: Pulmonary effort is normal. No respiratory distress.     Breath sounds: Normal breath sounds. No wheezing.     Comments: Cough is hacking and appears "tight" Chest:     Chest wall: No mass or tenderness.  Breasts:    Tanner Score is 5.     Right: Normal. No mass or tenderness.     Left: Normal. No mass or tenderness.  Abdominal:     General: Abdomen is flat. Bowel sounds are normal. There is no distension.     Palpations: Abdomen is soft.     Tenderness: There is no abdominal tenderness.  Genitourinary:    Comments: Followed by GYN Musculoskeletal:        General: No swelling or tenderness. Normal range of motion.     Cervical back: Full passive range of motion without pain, normal range of motion and neck supple.     Right lower leg: No edema.     Left lower leg: No edema.  Lymphadenopathy:     Upper Body:     Right upper body: No supraclavicular, axillary or pectoral adenopathy.     Left upper body: No supraclavicular, axillary or pectoral adenopathy.  Skin:    General: Skin is warm and dry.     Capillary Refill: Capillary refill takes less than 2 seconds.  Neurological:     General: No focal deficit present.      Mental Status: She is alert and oriented to person, place, and time.     Cranial Nerves: No cranial nerve deficit.     Sensory: No sensory deficit.     Motor: No weakness.  Psychiatric:        Mood and Affect: Mood normal.        Behavior: Behavior normal.        Thought Content: Thought content normal.  Judgment: Judgment normal.         Assessment And Plan:     1. Encounter for annual physical exam Behavior modifications discussed and diet history reviewed.   Pt will continue to exercise regularly and modify diet with low GI, plant based foods and decrease intake of processed foods.  Recommend intake of daily multivitamin, Vitamin D, and calcium.  Recommend for preventive screenings, as well as recommend immunizations that include influenza, TDAP - CMP14+EGFR - CBC  2. Class 3 severe obesity due to excess calories without serious comorbidity with body mass index (BMI) of 45.0 to 49.9 in adult [facility]) She is encouraged to strive for BMI less than 30 to decrease cardiac risk. Advised to aim for at least 150 minutes of exercise per week.  She has tried phentermine and HCG injections at [facility] but was becoming costly.  we have sent a rx for Saxenda to see if she will get approved, discussed side effects to include nausea, difficulty swallowing and abdominal pain. She is to titrate weekly as tolerated. Goal to lose 10% body weight in 4 months if approved by insurance - Liraglutide -Weight Management (SAXENDA) 18 MG/3ML SOPN; Inject 3 mg into the skin daily.  Dispense: 15 mL; Refill: 1  3. Elevated LDL cholesterol level Comments: Slightly elevated at last visit, encouraged to eat a low fat diet. Increase fiber intake.  - Lipid panel  4. Bilateral impacted cerumen Comments: Hard cerumen removed from bilateral ears with lighted curette with success. Encouraged to use debrox gtts at least once a week   Patient was given opportunity to ask questions. Patient verbalized  understanding of the plan and was able to repeat key elements of the plan. All questions were answered to their satisfaction.   [name], FNP   I, [name], FNP, have reviewed all documentation for this visit. The documentation on 06/27/22 for the exam, diagnosis, procedures, and orders are all accurate and complete.   THE PATIENT IS ENCOURAGED TO PRACTICE SOCIAL DISTANCING DUE TO THE COVID-19 PANDEMIC.

## 2022-06-27 NOTE — Patient Instructions (Signed)

## 2022-06-28 LAB — CMP14+EGFR
ALT: 18 IU/L (ref 0–32)
AST: 19 IU/L (ref 0–40)
Albumin/Globulin Ratio: 1.8 (ref 1.2–2.2)
Albumin: 4.4 g/dL (ref 4.0–5.0)
Alkaline Phosphatase: 122 IU/L — ABNORMAL HIGH (ref 44–121)
BUN/Creatinine Ratio: 8 — ABNORMAL LOW (ref 9–23)
BUN: 6 mg/dL (ref 6–20)
Bilirubin Total: 0.3 mg/dL (ref 0.0–1.2)
CO2: 22 mmol/L (ref 20–29)
Calcium: 9.2 mg/dL (ref 8.7–10.2)
Chloride: 103 mmol/L (ref 96–106)
Creatinine, Ser: 0.75 mg/dL (ref 0.57–1.00)
Globulin, Total: 2.5 g/dL (ref 1.5–4.5)
Glucose: 79 mg/dL (ref 70–99)
Potassium: 3.9 mmol/L (ref 3.5–5.2)
Sodium: 140 mmol/L (ref 134–144)
Total Protein: 6.9 g/dL (ref 6.0–8.5)
eGFR: 113 mL/min/{1.73_m2} (ref >59)

## 2022-06-28 LAB — CBC
Hematocrit: 41.3 % (ref 34.0–46.6)
Hemoglobin: 13.6 g/dL (ref 11.1–15.9)
MCH: 27.9 pg (ref 26.6–33.0)
MCHC: 32.9 g/dL (ref 31.5–35.7)
MCV: 85 fL (ref 79–97)
Platelets: 253 10*3/uL (ref 150–450)
RBC: 4.87 x10E6/uL (ref 3.77–5.28)
RDW: 13.5 % (ref 11.7–15.4)
WBC: 7.9 10*3/uL (ref 3.4–10.8)

## 2022-06-28 LAB — LIPID PANEL
Chol/HDL Ratio: 3.7 ratio (ref 0.0–4.4)
Cholesterol, Total: 172 mg/dL (ref 100–199)
HDL: 46 mg/dL (ref >39)
LDL Chol Calc (NIH): 108 mg/dL — ABNORMAL HIGH (ref 0–99)
Triglycerides: 96 mg/dL (ref 0–149)
VLDL Cholesterol Cal: 18 mg/dL (ref 5–40)

## 2022-07-28 ENCOUNTER — Other Ambulatory Visit: Payer: Self-pay

## 2022-08-31 LAB — HM PAP SMEAR

## 2022-09-25 ENCOUNTER — Encounter (INDEPENDENT_AMBULATORY_CARE_PROVIDER_SITE_OTHER): Payer: Self-pay

## 2022-12-30 ENCOUNTER — Encounter: Payer: Self-pay | Admitting: Nurse Practitioner

## 2023-07-02 ENCOUNTER — Encounter: Payer: 59 | Admitting: Nurse Practitioner

## 2023-07-23 ENCOUNTER — Ambulatory Visit (INDEPENDENT_AMBULATORY_CARE_PROVIDER_SITE_OTHER): Payer: 59 | Admitting: Nurse Practitioner

## 2023-07-23 ENCOUNTER — Encounter: Payer: Self-pay | Admitting: Nurse Practitioner

## 2023-07-23 VITALS — BP 110/70 | HR 85 | Temp 98.7°F | Ht 62.0 in | Wt 260.6 lb

## 2023-07-23 DIAGNOSIS — Z0000 Encounter for general adult medical examination without abnormal findings: Secondary | ICD-10-CM

## 2023-07-23 DIAGNOSIS — E7800 Pure hypercholesterolemia, unspecified: Secondary | ICD-10-CM | POA: Diagnosis not present

## 2023-07-23 DIAGNOSIS — Z6841 Body Mass Index (BMI) 40.0 and over, adult: Secondary | ICD-10-CM

## 2023-07-23 DIAGNOSIS — Z79899 Other long term (current) drug therapy: Secondary | ICD-10-CM

## 2023-07-23 MED ORDER — PHENTERMINE HCL 15 MG PO CAPS: 15.0000 mg | ORAL_CAPSULE | ORAL | 1 refills | Status: DC

## 2023-07-23 NOTE — Progress Notes (Signed)
[name], CMA,acting as a Neurosurgeon for Arnette Felts, FNP.,have documented all relevant documentation on the behalf of Arnette Felts, FNP,as directed by  Arnette Felts, FNP while in the presence of Arnette Felts, FNP.  Subjective:    Patient ID: Dana Galvan , female    DOB: Jul 05, 1996 , 27 y.o.   MRN: [id number]  Chief Complaint  Patient presents with   Annual Exam    HPI  Patient presents today for HM, Patient reports compliance with medications. Patient denies any chest pain, SOB, or headache. Patient has no concerns today. [facility] with [facility].   Breakfast -  Lunch - meal prep -  Dinner - sometimes will skip dinner  BP Readings from Last 3 Encounters: 07/23/23 : 110/70 06/27/22 : 118/82 05/03/22 : 110/64       Past Medical History:  Diagnosis Date   Recurrent upper respiratory infection (URI)      Family History  Problem Relation Age of Onset   Diabetes Mother    Heart disease Mother    Carpal tunnel syndrome Mother    Hypertension Mother    Drug abuse Mother    Alcohol abuse Mother    Drug abuse Father    Alcohol abuse Father      Current Outpatient Medications:    Budeson-Glycopyrrol-Formoterol (BREZTRI AEROSPHERE) 160-9-4.8 MCG/ACT AERO, Inhale 2 puffs into the lungs in the morning and at bedtime., Disp: 11.8 g, Rfl: 0   Etonogestrel (NEXPLANON [place]), Inject into the skin., Disp: , Rfl:    Fluticasone-Salmeterol,sensor, (AIRDUO DIGIHALER) 232-14 MCG/ACT AEPB, Inhale 1 puff into the lungs 2 (two) times daily., Disp: 1 each, Rfl: 2   phentermine 15 MG capsule, Take 1 capsule (15 mg total) by mouth every morning., Disp: 30 capsule, Rfl: 1   Allergies  Allergen Reactions   Amoxicillin Anaphylaxis   Penicillins Anaphylaxis      The patient states she uses Nexplanon for birth control. LMP - 07/04/2023. irregularNegative for Dysmenorrhea and Negative for Menorrhagia. Negative for: breast discharge, breast lump(s), breast  pain and breast self exam. Associated symptoms include abnormal vaginal bleeding. Pertinent negatives include abnormal bleeding (hematology), anxiety, decreased libido, depression, difficulty falling sleep, dyspareunia, history of infertility, nocturia, sexual dysfunction, sleep disturbances, urinary incontinence, urinary urgency, vaginal discharge and vaginal itching. Diet regular. The patient states her exercise level is moderate - she was kick boxing. She is now doing pole dancing and gym 5 all together.   Wt Readings from Last 3 Encounters:  07/23/23 260 lb 9.6 oz (118.2 kg)  06/27/22 265 lb 9.6 oz (120.5 kg)  05/03/22 262 lb (118.8 kg)   Her highest weight after her visit here last year was 270 lbs (April 2024).    The patient's tobacco use is:  Social History   Tobacco Use  Smoking Status Never  Smokeless Tobacco Never   She has been exposed to passive smoke. The patient's alcohol use is:  Social History   Substance and Sexual Activity  Alcohol Use Yes   Comment: occasionally    Additional information: Last pap 08/2020, next one scheduled for 08/2023.    Review of Systems  Constitutional: Negative.   HENT: Negative.    Eyes: Negative.   Respiratory: Negative.    Cardiovascular: Negative.   Gastrointestinal: Negative.   Endocrine: Negative.   Genitourinary: Negative.   Musculoskeletal: Negative.   Skin: Negative.   Allergic/Immunologic: Negative.   Neurological: Negative.   Hematological: Negative.   Psychiatric/Behavioral: Negative.  Today's Vitals   07/23/23 1450  BP: 110/70  Pulse: 85  Temp: 98.7 F (37.1 C)  TempSrc: Oral  Weight: 260 lb 9.6 oz (118.2 kg)  Height: 5\' 2"  (1.575 m)  PainSc: 0-No pain   Body mass index is 47.66 kg/m.  Wt Readings from Last 3 Encounters:  07/23/23 260 lb 9.6 oz (118.2 kg)  06/27/22 265 lb 9.6 oz (120.5 kg)  05/03/22 262 lb (118.8 kg)     Objective:  Physical Exam Vitals reviewed.  Constitutional:       General: She is not in acute distress.    Appearance: Normal appearance. She is well-developed. She is obese.  HENT:     Head: Normocephalic and atraumatic.     Right Ear: Hearing, tympanic membrane, ear canal and external ear normal. There is no impacted cerumen.     Left Ear: Hearing, tympanic membrane, ear canal and external ear normal. There is no impacted cerumen.     Nose: Nose normal.     Mouth/Throat:     Mouth: Mucous membranes are moist.  Eyes:     General: Lids are normal.     Extraocular Movements: Extraocular movements intact.     Conjunctiva/sclera: Conjunctivae normal.     Pupils: Pupils are equal, round, and reactive to light.     Funduscopic exam:    Right eye: No papilledema.        Left eye: No papilledema.  Neck:     Thyroid: No thyroid mass.     Vascular: No carotid bruit.  Cardiovascular:     Rate and Rhythm: Normal rate and regular rhythm.     Pulses: Normal pulses.     Heart sounds: Normal heart sounds. No murmur heard. Pulmonary:     Effort: Pulmonary effort is normal. No respiratory distress.     Breath sounds: Normal breath sounds. No wheezing.     Comments: Cough is hacking and appears "tight" Chest:     Chest wall: No mass or tenderness.  Breasts:    Tanner Score is 5.     Right: Normal. No mass or tenderness.     Left: Normal. No mass or tenderness.  Abdominal:     General: Abdomen is flat. Bowel sounds are normal. There is no distension.     Palpations: Abdomen is soft.     Tenderness: There is no abdominal tenderness.  Genitourinary:    Comments: Followed by GYN Musculoskeletal:        General: No swelling or tenderness. Normal range of motion.     Cervical back: Full passive range of motion without pain, normal range of motion and neck supple.     Right lower leg: No edema.     Left lower leg: No edema.  Lymphadenopathy:     Upper Body:     Right upper body: No supraclavicular, axillary or pectoral adenopathy.     Left upper body: No  supraclavicular, axillary or pectoral adenopathy.  Skin:    General: Skin is warm and dry.     Capillary Refill: Capillary refill takes less than 2 seconds.  Neurological:     General: No focal deficit present.     Mental Status: She is alert and oriented to person, place, and time.     Cranial Nerves: No cranial nerve deficit.     Sensory: No sensory deficit.     Motor: No weakness.  Psychiatric:        Mood and Affect: Mood normal.  Behavior: Behavior normal.        Thought Content: Thought content normal.        Judgment: Judgment normal.         Assessment And Plan:     Encounter for annual health examination Assessment & Plan: Behavior modifications discussed and diet history reviewed.   Pt will continue to exercise regularly and modify diet with low GI, plant based foods and decrease intake of processed foods.  Recommend intake of daily multivitamin, Vitamin D, and calcium.  Recommend m monthly self breast exams for preventive screenings, as well as recommend immunizations that include influenza, TDAP   Orders: -     CBC with Differential/Platelet  Elevated LDL cholesterol level Assessment & Plan: LDL improved slightly at last visit.  Limit intake of fried and fatty foods.  Orders: -     Lipid panel  Class 3 severe obesity due to excess calories without serious comorbidity with body mass index (BMI) of 45.0 to 49.9 in adult [facility]) Assessment & Plan: She is encouraged to strive for BMI less than 30 to decrease cardiac risk. Advised to aim for at least 150 minutes of exercise per week. Discussed in detail ways to cut down calories and incorporate more HIIT training.  Will start her on phentermine low-dose discussed side effects to include dry mouth and heart palpitations.  EKG is normal will return in 2 months for follow-up  Orders: -     CMP14+EGFR -     Ambulatory referral to Sleep Studies -     Phentermine HCl; Take 1 capsule (15 mg total) by mouth every  morning.  Dispense: 30 capsule; Refill: 1 -     EKG 12-Lead  High risk medication use Assessment & Plan: Phentermine started at 15 mg daily      Return for 1 year physical; 2 month weight check. Patient was given opportunity to ask questions. Patient verbalized understanding of the plan and was able to repeat key elements of the plan. All questions were answered to their satisfaction.   Arnette Felts, FNP   I, Arnette Felts, FNP, have reviewed all documentation for this visit. The documentation on 07/23/23 for the exam, diagnosis, procedures, and orders are all accurate and complete.

## 2023-07-24 LAB — CBC WITH DIFFERENTIAL/PLATELET
Basophils Absolute: 0 10*3/uL (ref 0.0–0.2)
Basos: 0 %
EOS (ABSOLUTE): 0.2 10*3/uL (ref 0.0–0.4)
Eos: 3 %
Hematocrit: 42.6 % (ref 34.0–46.6)
Hemoglobin: 13.7 g/dL (ref 11.1–15.9)
Immature Grans (Abs): 0 10*3/uL (ref 0.0–0.1)
Immature Granulocytes: 0 %
Lymphocytes Absolute: 1.9 10*3/uL (ref 0.7–3.1)
Lymphs: 31 %
MCH: 28 pg (ref 26.6–33.0)
MCHC: 32.2 g/dL (ref 31.5–35.7)
MCV: 87 fL (ref 79–97)
Monocytes Absolute: 0.5 10*3/uL (ref 0.1–0.9)
Monocytes: 8 %
Neutrophils Absolute: 3.6 10*3/uL (ref 1.4–7.0)
Neutrophils: 58 %
Platelets: 268 10*3/uL (ref 150–450)
RBC: 4.9 x10E6/uL (ref 3.77–5.28)
RDW: 13.5 % (ref 11.7–15.4)
WBC: 6.3 10*3/uL (ref 3.4–10.8)

## 2023-07-24 LAB — CMP14+EGFR
ALT: 36 IU/L — ABNORMAL HIGH (ref 0–32)
AST: 23 IU/L (ref 0–40)
Albumin: 4.3 g/dL (ref 4.0–5.0)
Alkaline Phosphatase: 129 IU/L — ABNORMAL HIGH (ref 44–121)
BUN/Creatinine Ratio: 11 (ref 9–23)
BUN: 8 mg/dL (ref 6–20)
Bilirubin Total: <0.2 mg/dL (ref 0.0–1.2)
CO2: 22 mmol/L (ref 20–29)
Calcium: 9.2 mg/dL (ref 8.7–10.2)
Chloride: 103 mmol/L (ref 96–106)
Creatinine, Ser: 0.74 mg/dL (ref 0.57–1.00)
Globulin, Total: 2.4 g/dL (ref 1.5–4.5)
Glucose: 88 mg/dL (ref 70–99)
Potassium: 4.1 mmol/L (ref 3.5–5.2)
Sodium: 140 mmol/L (ref 134–144)
Total Protein: 6.7 g/dL (ref 6.0–8.5)
eGFR: 113 mL/min/{1.73_m2} (ref >59)

## 2023-07-24 LAB — LIPID PANEL
Chol/HDL Ratio: 4 ratio (ref 0.0–4.4)
Cholesterol, Total: 197 mg/dL (ref 100–199)
HDL: 49 mg/dL (ref >39)
LDL Chol Calc (NIH): 123 mg/dL — ABNORMAL HIGH (ref 0–99)
Triglycerides: 142 mg/dL (ref 0–149)
VLDL Cholesterol Cal: 25 mg/dL (ref 5–40)

## 2023-07-27 ENCOUNTER — Encounter: Payer: Self-pay | Admitting: Nurse Practitioner

## 2023-07-27 DIAGNOSIS — Z79899 Other long term (current) drug therapy: Secondary | ICD-10-CM | POA: Insufficient documentation

## 2023-07-27 DIAGNOSIS — E7800 Pure hypercholesterolemia, unspecified: Secondary | ICD-10-CM | POA: Insufficient documentation

## 2023-07-27 DIAGNOSIS — Z0000 Encounter for general adult medical examination without abnormal findings: Secondary | ICD-10-CM | POA: Insufficient documentation

## 2023-07-27 NOTE — Assessment & Plan Note (Addendum)
LDL improved slightly at last visit.  Limit intake of fried and fatty foods.

## 2023-07-27 NOTE — Assessment & Plan Note (Signed)
Phentermine started at 15 mg daily

## 2023-07-27 NOTE — Assessment & Plan Note (Signed)
She is encouraged to strive for BMI less than 30 to decrease cardiac risk. Advised to aim for at least 150 minutes of exercise per week. Discussed in detail ways to cut down calories and incorporate more HIIT training.  Will start her on phentermine low-dose discussed side effects to include dry mouth and heart palpitations.  EKG is normal will return in 2 months for follow-up

## 2023-07-27 NOTE — Assessment & Plan Note (Signed)
Behavior modifications discussed and diet history reviewed.   Pt will continue to exercise regularly and modify diet with low GI, plant based foods and decrease intake of processed foods.  Recommend intake of daily multivitamin, Vitamin D, and calcium.  Recommend m monthly self breast exams for preventive screenings, as well as recommend immunizations that include influenza, TDAP

## 2023-08-01 ENCOUNTER — Other Ambulatory Visit: Payer: Self-pay | Admitting: Nurse Practitioner

## 2023-09-24 ENCOUNTER — Ambulatory Visit: Payer: 59 | Admitting: Nurse Practitioner

## 2024-07-23 ENCOUNTER — Encounter: Payer: Self-pay | Admitting: Nurse Practitioner

## 2024-07-23 ENCOUNTER — Ambulatory Visit: Payer: Self-pay | Admitting: Nurse Practitioner

## 2024-07-23 VITALS — BP 120/80 | HR 72 | Temp 98.6°F | Ht 62.0 in | Wt 274.4 lb

## 2024-07-23 DIAGNOSIS — Z6841 Body Mass Index (BMI) 40.0 and over, adult: Secondary | ICD-10-CM

## 2024-07-23 DIAGNOSIS — Z0000 Encounter for general adult medical examination without abnormal findings: Secondary | ICD-10-CM

## 2024-07-23 DIAGNOSIS — Z113 Encounter for screening for infections with a predominantly sexual mode of transmission: Secondary | ICD-10-CM

## 2024-07-23 DIAGNOSIS — E7800 Pure hypercholesterolemia, unspecified: Secondary | ICD-10-CM | POA: Diagnosis not present

## 2024-07-23 DIAGNOSIS — Z139 Encounter for screening, unspecified: Secondary | ICD-10-CM

## 2024-07-23 DIAGNOSIS — E66813 Obesity, class 3: Secondary | ICD-10-CM | POA: Diagnosis not present

## 2024-07-23 DIAGNOSIS — Z2821 Immunization not carried out because of patient refusal: Secondary | ICD-10-CM

## 2024-07-23 DIAGNOSIS — Z1159 Encounter for screening for other viral diseases: Secondary | ICD-10-CM

## 2024-07-23 NOTE — Progress Notes (Signed)
 LILLETTE Kristeen JINNY Gladis, CMA,acting as a Neurosurgeon for Gaines Ada, FNP.,have documented all relevant documentation on the behalf of Gaines Ada, FNP,as directed by  Gaines Ada, FNP while in the presence of Gaines Ada, FNP.  Subjective:    Patient ID: Dana Galvan , female    DOB: 09-Sep-1996 , 28 y.o.   MRN: [id number]  Chief Complaint  Patient presents with   Annual Exam    Patient presents today for HM, Patient reports compliance with medication. Patient denies any chest pain, SOB, or headaches. Patient has no concerns today.      HPI Discussed the use of AI scribe software for clinical note transcription with the patient, who gave verbal consent to proceed.  History of Present Illness Dana Galvan is a 28 year old female who presents for an annual physical exam.  She has a Nexplanon implant for contraception. She experiences no menstrual cycles but has symptoms associated with her cycle. She has not had sexual intercourse in the past eleven months and is interested in screening for STDs, including gonorrhea, chlamydia, trichomonas, HIV, syphilis, and hepatitis C. No symptoms of HSV 1 or 2 are present.  She recently returned from a month-long vacation, which included a cruise and trips to [place] and [place]. During this time, she experienced some diarrhea but denies any other gastrointestinal symptoms. She is trying to improve her diet and notes that her weight is currently higher than usual. Prior to her vacation, she exercised regularly, attending the gym four days a week and participating in pole dancing classes.  She denies any swelling in her feet or ankles. She occasionally checks her breasts but not on a monthly basis. There is no family history of breast cancer. 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   Has only seen her OB/GYN Dr. Cyndee at [facility].      Past Medical History:  Diagnosis Date   Allergy    Anxiety    Asthma    COPD  (chronic obstructive pulmonary disease) (HCC)    Recurrent upper respiratory infection (URI)      Family History  Problem Relation Age of Onset   Diabetes Mother    Heart disease Mother    Carpal tunnel syndrome Mother    Hypertension Mother    Drug abuse Mother    Alcohol abuse Mother    Asthma Mother    Drug abuse Father    Alcohol abuse Father      Current Outpatient Medications:    Etonogestrel (NEXPLANON [place]), Inject into the skin., Disp: , Rfl:    Allergies  Allergen Reactions   Amoxicillin Anaphylaxis   Penicillins Anaphylaxis     The patient states she uses Nexplanon for birth control. No LMP recorded. Patient has had an implant.. Negative for Dysmenorrhea and Negative for Menorrhagia. Negative for: breast discharge, breast lump(s), breast pain and breast self exam. Associated symptoms include abnormal vaginal bleeding. Pertinent negatives include abnormal bleeding (hematology), anxiety, decreased libido, depression, difficulty falling sleep, dyspareunia, history of infertility, nocturia, sexual dysfunction, sleep disturbances, urinary incontinence, urinary urgency, vaginal discharge and vaginal itching. Diet regular - she is trying to eat a little better. She has just come off a month vacation. The patient states her exercise level is moderate - 4 days a week. Mon-Wed gym  and Tamara is pole dancing.   The patient's tobacco use is:  Social History   Tobacco Use  Smoking Status Never  Smokeless Tobacco Never  She has been exposed to passive smoke. The patient's alcohol use is:  Social History   Substance and Sexual Activity  Alcohol Use Yes   Comment: occasionally      Review of Systems  Constitutional: Negative.   HENT: Negative.    Eyes: Negative.   Respiratory: Negative.    Cardiovascular: Negative.   Gastrointestinal: Negative.   Endocrine: Negative.   Genitourinary: Negative.   Musculoskeletal: Negative.   Skin: Negative.   Allergic/Immunologic:  Negative.   Neurological: Negative.   Hematological: Negative.   Psychiatric/Behavioral: Negative.       Today's Vitals   07/23/24 1414  BP: 120/80  Pulse: 72  Temp: 98.6 F (37 C)  TempSrc: Oral  Weight: 274 lb 6.4 oz (124.5 kg)  Height: 5' 2 (1.575 m)  PainSc: 0-No pain   Body mass index is 50.19 kg/m.  Wt Readings from Last 3 Encounters:  07/23/24 274 lb 6.4 oz (124.5 kg)  07/23/23 260 lb 9.6 oz (118.2 kg)  06/27/22 265 lb 9.6 oz (120.5 kg)     Objective:  Physical Exam Vitals and nursing note reviewed.  Constitutional:      General: She is not in acute distress.    Appearance: Normal appearance. She is well-developed. She is obese.  HENT:     Head: Normocephalic and atraumatic.     Right Ear: Hearing, tympanic membrane, ear canal and external ear normal. There is no impacted cerumen.     Left Ear: Hearing, tympanic membrane, ear canal and external ear normal. There is no impacted cerumen.     Nose: Nose normal.     Mouth/Throat:     Mouth: Mucous membranes are moist.  Eyes:     General: Lids are normal.     Extraocular Movements: Extraocular movements intact.     Conjunctiva/sclera: Conjunctivae normal.     Pupils: Pupils are equal, round, and reactive to light.     Funduscopic exam:    Right eye: No papilledema.        Left eye: No papilledema.  Neck:     Thyroid: No thyroid mass.     Vascular: No carotid bruit.  Cardiovascular:     Rate and Rhythm: Normal rate and regular rhythm.     Pulses: Normal pulses.     Heart sounds: Normal heart sounds. No murmur heard. Pulmonary:     Effort: Pulmonary effort is normal. No respiratory distress.     Breath sounds: Normal breath sounds. No wheezing.     Comments: Cough is hacking and appears tight Chest:     Chest wall: No mass or tenderness.  Breasts:    Tanner Score is 5.     Right: Normal. No mass or tenderness.     Left: Normal. No mass or tenderness.  Abdominal:     General: Abdomen is flat. Bowel  sounds are normal. There is no distension.     Palpations: Abdomen is soft.     Tenderness: There is no abdominal tenderness.  Genitourinary:    Comments: Followed by GYN Musculoskeletal:        General: No swelling or tenderness. Normal range of motion.     Cervical back: Full passive range of motion without pain, normal range of motion and neck supple.     Right lower leg: No edema.     Left lower leg: No edema.  Lymphadenopathy:     Upper Body:     Right upper body: No supraclavicular, axillary or pectoral adenopathy.  Left upper body: No supraclavicular, axillary or pectoral adenopathy.  Skin:    General: Skin is warm and dry.     Capillary Refill: Capillary refill takes less than 2 seconds.  Neurological:     General: No focal deficit present.     Mental Status: She is alert and oriented to person, place, and time.     Cranial Nerves: No cranial nerve deficit.     Sensory: No sensory deficit.     Motor: No weakness.  Psychiatric:        Mood and Affect: Mood normal.        Behavior: Behavior normal.        Thought Content: Thought content normal.        Judgment: Judgment normal.     Assessment And Plan:     Encounter for annual health examination Assessment & Plan: Routine wellness visit with multiple health concerns noted. No significant family history or current symptoms of concern. Occasional constipation noted. - Obtain recent Pap smear from Dr. Cyndee. - Screen for hepatitis B immunity and vaccinate if not immune. - Screen for STDs: gonorrhea, chlamydia, trichomonas, HIV, syphilis, hepatitis C. - Advised against HSV 1 and 2 testing without symptoms. - Encourage monthly breast self-examinations. - Order labs for cholesterol, A1c, kidney function, liver function.  Orders: -     CBC with Differential/Platelet  Elevated LDL cholesterol level Assessment & Plan: LDL improved slightly at last visit.  Limit intake of fried and fatty foods.  Orders: -      CMP14+EGFR -     Lipid panel  COVID-19 vaccination declined  Tetanus, diphtheria, and acellular pertussis (Tdap) vaccination declined  Screening for STDs (sexually transmitted diseases) -     Chlamydia/Gonococcus/Trichomonas, NAA -     HIV Antibody (routine testing w rflx) -     RPR  Class 3 severe obesity due to excess calories with body mass index (BMI) of 50.0 to 59.9 in adult, unspecified whether serious comorbidity present -     Hemoglobin A1c  Encounter for hepatitis C screening test for low risk patient -     Hepatitis C antibody  Encounter for screening -     Hepatitis B surface antibody,qualitative  Class 3 severe obesity due to excess calories without serious comorbidity with body mass index (BMI) of 45.0 to 49.9 in adult Assessment & Plan: Severe obesity with BMI 45.0-49.9. No specific diet but attempts healthier eating. Regular exercise routine prior to vacation. - Encourage resumption of regular exercise routine post-vacation. - Continue efforts to improve dietary habits.     Return for 1 year physical, 6 month chol check. Patient was given opportunity to ask questions. Patient verbalized understanding of the plan and was able to repeat key elements of the plan. All questions were answered to their satisfaction.   Gaines Ada, FNP  I, Gaines Ada, FNP, have reviewed all documentation for this visit. The documentation on 07/23/24 for the exam, diagnosis, procedures, and orders are all accurate and complete.

## 2024-07-24 LAB — CMP14+EGFR
ALT: 22 IU/L (ref 0–32)
AST: 21 IU/L (ref 0–40)
Albumin: 4.4 g/dL (ref 4.0–5.0)
Alkaline Phosphatase: 133 IU/L — ABNORMAL HIGH (ref 44–121)
BUN/Creatinine Ratio: 8 — ABNORMAL LOW (ref 9–23)
BUN: 7 mg/dL (ref 6–20)
Bilirubin Total: 0.3 mg/dL (ref 0.0–1.2)
CO2: 24 mmol/L (ref 20–29)
Calcium: 9.3 mg/dL (ref 8.7–10.2)
Chloride: 99 mmol/L (ref 96–106)
Creatinine, Ser: 0.83 mg/dL (ref 0.57–1.00)
Globulin, Total: 2.6 g/dL (ref 1.5–4.5)
Glucose: 114 mg/dL — ABNORMAL HIGH (ref 70–99)
Potassium: 3.9 mmol/L (ref 3.5–5.2)
Sodium: 137 mmol/L (ref 134–144)
Total Protein: 7 g/dL (ref 6.0–8.5)
eGFR: 98 mL/min/1.73 (ref >59)

## 2024-07-24 LAB — CBC WITH DIFFERENTIAL/PLATELET
Basophils Absolute: 0 x10E3/uL (ref 0.0–0.2)
Basos: 0 %
EOS (ABSOLUTE): 0.2 x10E3/uL (ref 0.0–0.4)
Eos: 2 %
Hematocrit: 41.6 % (ref 34.0–46.6)
Hemoglobin: 13.5 g/dL (ref 11.1–15.9)
Immature Grans (Abs): 0 x10E3/uL (ref 0.0–0.1)
Immature Granulocytes: 0 %
Lymphocytes Absolute: 2.3 x10E3/uL (ref 0.7–3.1)
Lymphs: 31 %
MCH: 28.2 pg (ref 26.6–33.0)
MCHC: 32.5 g/dL (ref 31.5–35.7)
MCV: 87 fL (ref 79–97)
Monocytes Absolute: 0.6 x10E3/uL (ref 0.1–0.9)
Monocytes: 7 %
Neutrophils Absolute: 4.4 x10E3/uL (ref 1.4–7.0)
Neutrophils: 60 %
Platelets: 273 x10E3/uL (ref 150–450)
RBC: 4.79 x10E6/uL (ref 3.77–5.28)
RDW: 13.2 % (ref 11.7–15.4)
WBC: 7.4 x10E3/uL (ref 3.4–10.8)

## 2024-07-24 LAB — LIPID PANEL
Chol/HDL Ratio: 4 ratio (ref 0.0–4.4)
Cholesterol, Total: 194 mg/dL (ref 100–199)
HDL: 48 mg/dL (ref >39)
LDL Chol Calc (NIH): 126 mg/dL — ABNORMAL HIGH (ref 0–99)
Triglycerides: 111 mg/dL (ref 0–149)
VLDL Cholesterol Cal: 20 mg/dL (ref 5–40)

## 2024-07-24 LAB — HIV ANTIBODY (ROUTINE TESTING W REFLEX): HIV Screen 4th Generation wRfx: NONREACTIVE

## 2024-07-24 LAB — RPR: RPR Ser Ql: NONREACTIVE

## 2024-07-24 LAB — HEMOGLOBIN A1C
Est. average glucose Bld gHb Est-mCnc: 140 mg/dL
Hgb A1c MFr Bld: 6.5 % — ABNORMAL HIGH (ref 4.8–5.6)

## 2024-07-24 LAB — HEPATITIS B SURFACE ANTIBODY,QUALITATIVE: Hep B Surface Ab, Qual: NONREACTIVE

## 2024-07-24 LAB — HEPATITIS C ANTIBODY: Hep C Virus Ab: NONREACTIVE

## 2024-07-25 ENCOUNTER — Encounter: Payer: Self-pay | Admitting: Obstetrics and Gynecology

## 2024-07-25 LAB — CHLAMYDIA/GONOCOCCUS/TRICHOMONAS, NAA
Chlamydia by NAA: NEGATIVE
Gonococcus by NAA: NEGATIVE
Trich vag by NAA: NEGATIVE

## 2024-08-03 ENCOUNTER — Ambulatory Visit: Payer: Self-pay | Admitting: Nurse Practitioner

## 2024-08-03 NOTE — Assessment & Plan Note (Signed)
 Routine wellness visit with multiple health concerns noted. No significant family history or current symptoms of concern. Occasional constipation noted. - Obtain recent Pap smear from Dr. Cyndee. - Screen for hepatitis B immunity and vaccinate if not immune. - Screen for STDs: gonorrhea, chlamydia, trichomonas, HIV, syphilis, hepatitis C. - Advised against HSV 1 and 2 testing without symptoms. - Encourage monthly breast self-examinations. - Order labs for cholesterol, A1c, kidney function, liver function.

## 2024-08-03 NOTE — Assessment & Plan Note (Signed)
 Severe obesity with BMI 45.0-49.9. No specific diet but attempts healthier eating. Regular exercise routine prior to vacation. - Encourage resumption of regular exercise routine post-vacation. - Continue efforts to improve dietary habits.

## 2024-08-03 NOTE — Assessment & Plan Note (Signed)
LDL improved slightly at last visit.  Limit intake of fried and fatty foods.

## 2024-08-07 ENCOUNTER — Telehealth: Payer: Self-pay | Admitting: Nurse Practitioner

## 2024-08-07 NOTE — Telephone Encounter (Unsigned)
 Copied from CRM [id number]. Topic: General - Other >> Aug 07, 2024  4:12 PM Donee H wrote: Reason for CRM: Patient called requesting to speak directly to Gaines Ada nurse. She states she sent a message via Mychart a couple of days ago and never received a response. Please follow back up with patient via phone at  [phone]

## 2024-08-12 ENCOUNTER — Ambulatory Visit: Payer: Self-pay

## 2024-08-12 VITALS — BP 124/80 | HR 98 | Temp 98.5°F | Ht 62.0 in | Wt 274.0 lb

## 2024-08-12 DIAGNOSIS — E119 Type 2 diabetes mellitus without complications: Secondary | ICD-10-CM

## 2024-08-12 MED ORDER — SEMAGLUTIDE(0.25 OR 0.5MG/DOS) 2 MG/3ML ~~LOC~~ SOPN: PEN_INJECTOR | SUBCUTANEOUS | 2 refills | Status: DC

## 2024-08-12 NOTE — Progress Notes (Signed)
 Patient is in office today for a nurse visit for CGM Training. Patient was provided with instruction and demonstration on how to Administer Medication.  6 FOLLOW UP SCH.

## 2024-09-23 ENCOUNTER — Ambulatory Visit: Payer: Self-pay | Admitting: Nurse Practitioner

## 2024-09-23 ENCOUNTER — Encounter: Payer: Self-pay | Admitting: Nurse Practitioner

## 2024-09-23 VITALS — BP 120/80 | HR 70 | Temp 98.3°F | Ht 62.0 in | Wt 271.4 lb

## 2024-09-23 DIAGNOSIS — Z6841 Body Mass Index (BMI) 40.0 and over, adult: Secondary | ICD-10-CM

## 2024-09-23 DIAGNOSIS — E66813 Obesity, class 3: Secondary | ICD-10-CM | POA: Diagnosis not present

## 2024-09-23 DIAGNOSIS — E1169 Type 2 diabetes mellitus with other specified complication: Secondary | ICD-10-CM

## 2024-09-23 DIAGNOSIS — R200 Anesthesia of skin: Secondary | ICD-10-CM | POA: Diagnosis not present

## 2024-09-23 DIAGNOSIS — R202 Paresthesia of skin: Secondary | ICD-10-CM | POA: Diagnosis not present

## 2024-09-23 DIAGNOSIS — E119 Type 2 diabetes mellitus without complications: Secondary | ICD-10-CM | POA: Insufficient documentation

## 2024-09-23 NOTE — Progress Notes (Signed)
 LILLETTE Kristeen JINNY Gladis, CMA,acting as a neurosurgeon for Gaines Ada, FNP.,have documented all relevant documentation on the behalf of Gaines Ada, FNP,as directed by  Gaines Ada, FNP while in the presence of Gaines Ada, FNP.  Subjective:  Patient ID: Dana Galvan , female    DOB: Nov 22, 1996 , 28 y.o.   MRN: [id number]  Chief Complaint  Patient presents with  . Diabetes    Patient presents today for a dm follow up, Patient reports compliance with medication. Patient denies any chest pain, SOB, or headaches. Patient has no concerns today.   . Tingling    Patient reports for the last few days her left leg has been having tingling.     HPI  HPI   Past Medical History:  Diagnosis Date  . Allergy   . Anxiety   . Asthma   . COPD (chronic obstructive pulmonary disease) (HCC)   . Recurrent upper respiratory infection (URI)      Family History  Problem Relation Age of Onset  . Diabetes Mother   . Heart disease Mother   . Carpal tunnel syndrome Mother   . Hypertension Mother   . Drug abuse Mother   . Alcohol abuse Mother   . Asthma Mother   . Drug abuse Father   . Alcohol abuse Father      Current Outpatient Medications:  .  Etonogestrel (NEXPLANON [place]), Inject into the skin., Disp: , Rfl:  .  Semaglutide ,0.25 or 0.5MG /DOS, 2 MG/3ML SOPN, INJECT 0.25MG  INTO SKIN FOR 4 WEEKS THEN INCREASE TO 0.5MG  EVERY TUESDAY., Disp: 3 mL, Rfl: 2   Allergies  Allergen Reactions  . Amoxicillin Anaphylaxis  . Penicillins Anaphylaxis     Review of Systems  Constitutional: Negative.   Respiratory: Negative.    Cardiovascular: Negative.   Neurological: Negative.   Psychiatric/Behavioral: Negative.       Today's Vitals   09/23/24 1209  BP: 120/80  Pulse: 70  Temp: 98.3 F (36.8 C)  TempSrc: Oral  Weight: 271 lb 6.4 oz (123.1 kg)  Height: 5' 2 (1.575 m)  PainSc: 0-No pain   Body mass index is 49.64 kg/m.  Wt Readings from Last 3 Encounters:  09/23/24 271 lb 6.4 oz (123.1 kg)   08/12/24 274 lb (124.3 kg)  07/23/24 274 lb 6.4 oz (124.5 kg)     Objective:  Physical Exam Vitals and nursing note reviewed.  Constitutional:      General: She is not in acute distress.    Appearance: Normal appearance. She is well-developed. She is obese.  Cardiovascular:     Rate and Rhythm: Normal rate and regular rhythm.     Pulses: Normal pulses.     Heart sounds: Normal heart sounds. No murmur heard. Pulmonary:     Effort: Pulmonary effort is normal.     Breath sounds: Normal breath sounds.  Chest:     Chest wall: No tenderness.  Musculoskeletal:        General: Normal range of motion.  Skin:    General: Skin is warm and dry.     Capillary Refill: Capillary refill takes less than 2 seconds.  Neurological:     General: No focal deficit present.     Mental Status: She is alert and oriented to person, place, and time.  Psychiatric:        Mood and Affect: Mood normal.        Behavior: Behavior normal.        Thought Content: Thought content normal.  Judgment: Judgment normal.         Assessment And Plan:  Type 2 diabetes mellitus without complication, without long-term current use of insulin (HCC) -     Hemoglobin A1c -     CMP14+EGFR -     Microalbumin / creatinine urine ratio  Class 3 severe obesity due to excess calories without serious comorbidity with body mass index (BMI) of 45.0 to 49.9 in adult [facility])    Return for keep same next.  Patient was given opportunity to ask questions. Patient verbalized understanding of the plan and was able to repeat key elements of the plan. All questions were answered to their satisfaction.    LILLETTE Gaines Ada, FNP, have reviewed all documentation for this visit. The documentation on 09/23/24 for the exam, diagnosis, procedures, and orders are all accurate and complete.   IF YOU HAVE BEEN REFERRED TO A SPECIALIST, IT MAY TAKE 1-2 WEEKS TO SCHEDULE/PROCESS THE REFERRAL. IF YOU HAVE NOT HEARD FROM US /SPECIALIST IN TWO  WEEKS, PLEASE GIVE US  A CALL AT [phone].

## 2024-09-24 LAB — CMP14+EGFR
ALT: 15 IU/L (ref 0–32)
AST: 14 IU/L (ref 0–40)
Albumin: 4.3 g/dL (ref 4.0–5.0)
Alkaline Phosphatase: 126 IU/L — ABNORMAL HIGH (ref 41–116)
BUN/Creatinine Ratio: 10 (ref 9–23)
BUN: 8 mg/dL (ref 6–20)
Bilirubin Total: 0.4 mg/dL (ref 0.0–1.2)
CO2: 23 mmol/L (ref 20–29)
Calcium: 9.4 mg/dL (ref 8.7–10.2)
Chloride: 102 mmol/L (ref 96–106)
Creatinine, Ser: 0.82 mg/dL (ref 0.57–1.00)
Globulin, Total: 2.8 g/dL (ref 1.5–4.5)
Glucose: 79 mg/dL (ref 70–99)
Potassium: 4.3 mmol/L (ref 3.5–5.2)
Sodium: 139 mmol/L (ref 134–144)
Total Protein: 7.1 g/dL (ref 6.0–8.5)
eGFR: 99 mL/min/1.73 (ref >59)

## 2024-09-24 LAB — HEMOGLOBIN A1C
Est. average glucose Bld gHb Est-mCnc: 120 mg/dL
Hgb A1c MFr Bld: 5.8 % — ABNORMAL HIGH (ref 4.8–5.6)

## 2024-09-24 LAB — MICROALBUMIN / CREATININE URINE RATIO
Creatinine, Urine: 242.5 mg/dL
Microalb/Creat Ratio: 3 mg/g{creat} (ref 0–29)
Microalbumin, Urine: 6.6 ug/mL

## 2024-09-26 ENCOUNTER — Ambulatory Visit: Payer: Self-pay | Admitting: Nurse Practitioner

## 2024-09-26 DIAGNOSIS — R200 Anesthesia of skin: Secondary | ICD-10-CM | POA: Insufficient documentation

## 2024-09-26 NOTE — Assessment & Plan Note (Signed)
 Recent right thigh tingling, worsened by cold, relieved by warmth. Likely due to tight clothing or weight pressure on nerve. - Advise avoiding tight clothing around waist. - Order vitamin B and thyroid function tests. - Provide stretches to alleviate symptoms. - Instruct to report progression to numbness or weakness. - Advise emergency care if symptoms worsen, especially with weakness or numbness.

## 2024-09-26 NOTE — Assessment & Plan Note (Signed)
 On Ozempic  0.5 mg, tolerating well, 3-pound weight loss noted. - Continue Ozempic  0.5 mg. - Re-evaluate and consider dose increase after lab recheck in a few months.

## 2024-09-26 NOTE — Assessment & Plan Note (Signed)
 Managed with Ozempic , no side effects affecting blood sugar control. - Continue Ozempic . - Avoid steroids unless necessary.

## 2024-09-27 LAB — SPECIMEN STATUS REPORT

## 2024-09-27 LAB — TSH: TSH: 2.13 u[IU]/mL (ref 0.450–4.500)

## 2024-09-27 LAB — VITAMIN B12: Vitamin B-12: 360 pg/mL (ref 232–1245)

## 2024-10-01 ENCOUNTER — Other Ambulatory Visit: Payer: Self-pay | Admitting: Nurse Practitioner

## 2024-10-01 DIAGNOSIS — R202 Paresthesia of skin: Secondary | ICD-10-CM

## 2024-12-09 ENCOUNTER — Other Ambulatory Visit: Payer: Self-pay | Admitting: Nurse Practitioner

## 2024-12-09 DIAGNOSIS — E119 Type 2 diabetes mellitus without complications: Secondary | ICD-10-CM

## 2024-12-11 ENCOUNTER — Other Ambulatory Visit: Payer: Self-pay

## 2024-12-11 DIAGNOSIS — E119 Type 2 diabetes mellitus without complications: Secondary | ICD-10-CM

## 2024-12-11 MED ORDER — OZEMPIC (0.25 OR 0.5 MG/DOSE) 2 MG/3ML ~~LOC~~ SOPN: PEN_INJECTOR | SUBCUTANEOUS | 0 refills | Status: AC

## 2024-12-15 ENCOUNTER — Encounter: Payer: Self-pay | Admitting: Nurse Practitioner

## 2025-01-06 ENCOUNTER — Ambulatory Visit: Admitting: Nurse Practitioner

## 2025-01-19 ENCOUNTER — Ambulatory Visit: Payer: Self-pay | Admitting: Nurse Practitioner

## 2025-07-28 ENCOUNTER — Encounter: Payer: Self-pay | Admitting: Nurse Practitioner
